# Patient Record
Sex: Male | Born: 1980 | Race: White | Hispanic: No | Marital: Single | Smoking: Current every day smoker
Health system: Southern US, Community
[De-identification: ages and names within clinical notes are randomized; demographics above are authoritative.]

## PROBLEM LIST (undated history)

## (undated) DIAGNOSIS — R569 Unspecified convulsions: Secondary | ICD-10-CM

## (undated) DIAGNOSIS — K219 Gastro-esophageal reflux disease without esophagitis: Secondary | ICD-10-CM

## (undated) DIAGNOSIS — L409 Psoriasis, unspecified: Secondary | ICD-10-CM

## (undated) DIAGNOSIS — T7840XA Allergy, unspecified, initial encounter: Secondary | ICD-10-CM

## (undated) HISTORY — DX: Psoriasis, unspecified: L40.9

## (undated) HISTORY — DX: Allergy, unspecified, initial encounter: T78.40XA

---
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
## 2011-09-22 ENCOUNTER — Emergency Department: Payer: Self-pay | Admitting: Emergency Medicine

## 2011-09-22 LAB — URINALYSIS, COMPLETE
Bacteria: NONE SEEN
Bilirubin,UR: NEGATIVE
Blood: NEGATIVE
Glucose,UR: NEGATIVE mg/dL (ref 0–75)
Leukocyte Esterase: NEGATIVE
Nitrite: NEGATIVE
Ph: 6 (ref 4.5–8.0)
Protein: NEGATIVE
RBC,UR: NONE SEEN /HPF (ref 0–5)
Specific Gravity: 1.011 (ref 1.003–1.030)
Squamous Epithelial: NONE SEEN
WBC UR: <1 /HPF (ref 0–5)

## 2011-09-22 LAB — COMPREHENSIVE METABOLIC PANEL
Albumin: 4.2 g/dL (ref 3.4–5.0)
Alkaline Phosphatase: 64 U/L (ref 50–136)
Anion Gap: 3 — ABNORMAL LOW (ref 7–16)
BUN: 12 mg/dL (ref 7–18)
Bilirubin,Total: 0.6 mg/dL (ref 0.2–1.0)
Calcium, Total: 8.8 mg/dL (ref 8.5–10.1)
Chloride: 100 mmol/L (ref 98–107)
Co2: 30 mmol/L (ref 21–32)
Creatinine: 1.04 mg/dL (ref 0.60–1.30)
EGFR (African American): >60
EGFR (Non-African Amer.): >60
Glucose: 100 mg/dL — ABNORMAL HIGH (ref 65–99)
Osmolality: 266 (ref 275–301)
Potassium: 3.4 mmol/L — ABNORMAL LOW (ref 3.5–5.1)
SGOT(AST): 35 U/L (ref 15–37)
SGPT (ALT): 26 U/L
Sodium: 133 mmol/L — ABNORMAL LOW (ref 136–145)
Total Protein: 7.5 g/dL (ref 6.4–8.2)

## 2011-09-22 LAB — CBC WITH DIFFERENTIAL/PLATELET
Basophil #: 0 10*3/uL (ref 0.0–0.1)
Basophil %: 0.2 %
Eosinophil #: 0 10*3/uL (ref 0.0–0.7)
Eosinophil %: 0.2 %
HCT: 41.2 % (ref 40.0–52.0)
HGB: 14.4 g/dL (ref 13.0–18.0)
Lymphocyte #: 1.7 10*3/uL (ref 1.0–3.6)
Lymphocyte %: 16.4 %
MCH: 32.7 pg (ref 26.0–34.0)
MCHC: 35.1 g/dL (ref 32.0–36.0)
MCV: 93 fL (ref 80–100)
Monocyte #: 0.8 x10 3/mm (ref 0.2–1.0)
Monocyte %: 8 %
Neutrophil #: 7.9 10*3/uL — ABNORMAL HIGH (ref 1.4–6.5)
Neutrophil %: 75.2 %
Platelet: 206 10*3/uL (ref 150–440)
RBC: 4.42 10*6/uL (ref 4.40–5.90)
RDW: 12.1 % (ref 11.5–14.5)
WBC: 10.5 10*3/uL (ref 3.8–10.6)

## 2011-09-22 LAB — ETHANOL
Ethanol %: <0.003 % (ref 0.000–0.080)
Ethanol: <3 mg/dL

## 2014-03-09 ENCOUNTER — Emergency Department: Payer: Self-pay | Admitting: Emergency Medicine

## 2014-03-09 LAB — CBC WITH DIFFERENTIAL/PLATELET
Basophil #: 0.1 10*3/uL (ref 0.0–0.1)
Basophil %: 0.5 %
Eosinophil #: 0.1 10*3/uL (ref 0.0–0.7)
Eosinophil %: 0.9 %
HCT: 43.2 % (ref 40.0–52.0)
HGB: 14.8 g/dL (ref 13.0–18.0)
Lymphocyte #: 4 10*3/uL — ABNORMAL HIGH (ref 1.0–3.6)
Lymphocyte %: 40.2 %
MCH: 32.9 pg (ref 26.0–34.0)
MCHC: 34.3 g/dL (ref 32.0–36.0)
MCV: 96 fL (ref 80–100)
Monocyte #: 0.9 x10 3/mm (ref 0.2–1.0)
Monocyte %: 9.1 %
Neutrophil #: 5 10*3/uL (ref 1.4–6.5)
Neutrophil %: 49.3 %
Platelet: 318 10*3/uL (ref 150–440)
RBC: 4.5 10*6/uL (ref 4.40–5.90)
RDW: 11.8 % (ref 11.5–14.5)
WBC: 10.1 10*3/uL (ref 3.8–10.6)

## 2014-03-09 LAB — URINALYSIS, COMPLETE
Bacteria: NONE SEEN
Bilirubin,UR: NEGATIVE
Blood: NEGATIVE
Glucose,UR: NEGATIVE mg/dL (ref 0–75)
Ketone: NEGATIVE
Leukocyte Esterase: NEGATIVE
Nitrite: NEGATIVE
Ph: 6 (ref 4.5–8.0)
Protein: NEGATIVE
RBC,UR: NONE SEEN /HPF (ref 0–5)
Specific Gravity: 1.01 (ref 1.003–1.030)
Squamous Epithelial: NONE SEEN
WBC UR: <1 /HPF (ref 0–5)

## 2014-03-09 LAB — TROPONIN I: Troponin-I: <0.02 ng/mL

## 2014-03-09 LAB — BASIC METABOLIC PANEL
Anion Gap: 12 (ref 7–16)
BUN: 12 mg/dL (ref 7–18)
Calcium, Total: 8.5 mg/dL (ref 8.5–10.1)
Chloride: 101 mmol/L (ref 98–107)
Co2: 24 mmol/L (ref 21–32)
Creatinine: 0.96 mg/dL (ref 0.60–1.30)
EGFR (African American): >60
EGFR (Non-African Amer.): >60
Glucose: 108 mg/dL — ABNORMAL HIGH (ref 65–99)
Osmolality: 274 (ref 275–301)
Potassium: 2.9 mmol/L — ABNORMAL LOW (ref 3.5–5.1)
Sodium: 137 mmol/L (ref 136–145)

## 2014-05-21 LAB — HEPATIC FUNCTION PANEL
ALT: 34 U/L (ref 10–40)
AST: 34 U/L (ref 14–40)

## 2014-05-21 LAB — BASIC METABOLIC PANEL
BUN: 14 mg/dL (ref 4–21)
CREATININE: 1.1 mg/dL (ref 0.6–1.3)
Glucose: 104 mg/dL
Potassium: 4.4 mmol/L (ref 3.4–5.3)
SODIUM: 138 mmol/L (ref 137–147)

## 2014-05-21 LAB — CBC AND DIFFERENTIAL
HEMATOCRIT: 45 % (ref 41–53)
Hemoglobin: 15.6 g/dL (ref 13.5–17.5)
PLATELETS: 293 10*3/uL (ref 150–399)
WBC: 7 10^3/mL

## 2014-12-27 ENCOUNTER — Encounter: Payer: Self-pay | Admitting: Medical Oncology

## 2014-12-27 ENCOUNTER — Emergency Department
Admission: EM | Admit: 2014-12-27 | Discharge: 2014-12-27 | Disposition: A | Discharge status: Home / Self Care | Payer: Managed Care, Other (non HMO) | Attending: Emergency Medicine | Admitting: Emergency Medicine

## 2014-12-27 DIAGNOSIS — M25562 Pain in left knee: Secondary | ICD-10-CM | POA: Insufficient documentation

## 2014-12-27 DIAGNOSIS — B349 Viral infection, unspecified: Secondary | ICD-10-CM | POA: Insufficient documentation

## 2014-12-27 DIAGNOSIS — M25572 Pain in left ankle and joints of left foot: Secondary | ICD-10-CM | POA: Insufficient documentation

## 2014-12-27 DIAGNOSIS — M25531 Pain in right wrist: Secondary | ICD-10-CM | POA: Insufficient documentation

## 2014-12-27 DIAGNOSIS — M25461 Effusion, right knee: Secondary | ICD-10-CM | POA: Insufficient documentation

## 2014-12-27 DIAGNOSIS — M2550 Pain in unspecified joint: Secondary | ICD-10-CM

## 2014-12-27 DIAGNOSIS — M25431 Effusion, right wrist: Secondary | ICD-10-CM | POA: Diagnosis not present

## 2014-12-27 DIAGNOSIS — L539 Erythematous condition, unspecified: Secondary | ICD-10-CM | POA: Insufficient documentation

## 2014-12-27 DIAGNOSIS — M25561 Pain in right knee: Secondary | ICD-10-CM | POA: Diagnosis not present

## 2014-12-27 DIAGNOSIS — M25532 Pain in left wrist: Secondary | ICD-10-CM | POA: Insufficient documentation

## 2014-12-27 DIAGNOSIS — M25432 Effusion, left wrist: Secondary | ICD-10-CM | POA: Diagnosis not present

## 2014-12-27 DIAGNOSIS — R5383 Other fatigue: Secondary | ICD-10-CM | POA: Diagnosis present

## 2014-12-27 DIAGNOSIS — M25462 Effusion, left knee: Secondary | ICD-10-CM | POA: Insufficient documentation

## 2014-12-27 DIAGNOSIS — M25571 Pain in right ankle and joints of right foot: Secondary | ICD-10-CM | POA: Diagnosis not present

## 2014-12-27 LAB — CBC WITH DIFFERENTIAL/PLATELET
BASOS PCT: 0 %
Basophils Absolute: 0 10*3/uL (ref 0–0.1)
EOS ABS: 0.1 10*3/uL (ref 0–0.7)
Eosinophils Relative: 1 %
HCT: 43.9 % (ref 40.0–52.0)
HEMOGLOBIN: 15.1 g/dL (ref 13.0–18.0)
Lymphocytes Relative: 24 %
Lymphs Abs: 2.3 10*3/uL (ref 1.0–3.6)
MCH: 32.4 pg (ref 26.0–34.0)
MCHC: 34.3 g/dL (ref 32.0–36.0)
MCV: 94.3 fL (ref 80.0–100.0)
Monocytes Absolute: 0.8 10*3/uL (ref 0.2–1.0)
Monocytes Relative: 9 %
NEUTROS PCT: 66 %
Neutro Abs: 6.7 10*3/uL — ABNORMAL HIGH (ref 1.4–6.5)
Platelets: 228 10*3/uL (ref 150–440)
RBC: 4.66 MIL/uL (ref 4.40–5.90)
RDW: 12.1 % (ref 11.5–14.5)
WBC: 9.9 10*3/uL (ref 3.8–10.6)

## 2014-12-27 LAB — BASIC METABOLIC PANEL
ANION GAP: 6 (ref 5–15)
BUN: 12 mg/dL (ref 6–20)
CHLORIDE: 106 mmol/L (ref 101–111)
CO2: 26 mmol/L (ref 22–32)
CREATININE: 0.95 mg/dL (ref 0.61–1.24)
Calcium: 9.2 mg/dL (ref 8.9–10.3)
GFR calc non Af Amer: >60 mL/min (ref >60)
Glucose, Bld: 108 mg/dL — ABNORMAL HIGH (ref 65–99)
POTASSIUM: 4.2 mmol/L (ref 3.5–5.1)
SODIUM: 138 mmol/L (ref 135–145)

## 2014-12-27 LAB — SEDIMENTATION RATE: SED RATE: 7 mm/h (ref 0–15)

## 2014-12-27 MED ORDER — KETOROLAC TROMETHAMINE 30 MG/ML IJ SOLN
INTRAMUSCULAR | Status: AC
  Filled 2014-12-27: qty 1

## 2014-12-27 MED ORDER — KETOROLAC TROMETHAMINE 30 MG/ML IJ SOLN
30.0000 mg | Freq: Once | INTRAMUSCULAR | Status: AC
  Administered 2014-12-27: 30 mg via INTRAMUSCULAR

## 2014-12-27 NOTE — ED Notes (Signed)
Pt was sent from urgent care due to high BP, states he has been dizzy today

## 2014-12-27 NOTE — ED Notes (Signed)
[place] and since he has [place] he had a rash that was itchy which has resolved and now he reports that he is having joint pain and swelling. Pt went to urgent care and was sent here d/t elevated BP.

## 2014-12-27 NOTE — Discharge Instructions (Signed)
Brent Hutchinson, it is not clear what is causing your symptoms however I'm suspicious of a viral infection. We will treat this symptomatically but I would ask that you follow-up with Dr. Gavin Potters given your significant joint symptoms.   Joint Pain Joint pain, which is also called arthralgia, can be caused by many things. Joint pain often goes away when you follow your health care provider's instructions for relieving pain at home. However, joint pain can also be caused by conditions that require further treatment. Common causes of joint pain include:  Bruising in the area of the joint.  Overuse of the joint.  Wear and tear on the joints that occur with aging (osteoarthritis).  Various other forms of arthritis.  A buildup of a crystal form of uric acid in the joint (gout).  Infections of the joint (septic arthritis) or of the bone (osteomyelitis). Your health care provider may recommend medicine to help with the pain. If your joint pain continues, additional tests may be needed to diagnose your condition. HOME CARE INSTRUCTIONS Watch your condition for any changes. Follow these instructions as directed to lessen the pain that you are feeling.  Take medicines only as directed by your health care provider.  Rest the affected area for as long as your health care provider says that you should. If directed to do so, raise the painful joint above the level of your heart while you are sitting or lying down.  Do not do things that cause or worsen pain.  If directed, apply ice to the painful area:  Put ice in a plastic bag.  Place a towel between your skin and the bag.  Leave the ice on for 20 minutes, 2-3 times per day.  Wear an elastic bandage, splint, or sling as directed by your health care provider. Loosen the elastic bandage or splint if your fingers or toes become numb and tingle, or if they turn cold and blue.  Begin exercising or stretching the affected area as directed by your health  care provider. Ask your health care provider what types of exercise are safe for you.  Keep all follow-up visits as directed by your health care provider. This is important. SEEK MEDICAL CARE IF:  Your pain increases, and medicine does not help.  Your joint pain does not improve within 3 days.  You have increased bruising or swelling.  You have a fever.  You lose 10 lb (4.5 kg) or more without trying. SEEK IMMEDIATE MEDICAL CARE IF:  You are not able to move the joint.  Your fingers or toes become numb or they turn cold and blue.   This information is not intended to replace advice given to you by your health care provider. Make sure you discuss any questions you have with your health care provider.   Document Released: 03/06/2005 Document Revised: 03/27/2014 Document Reviewed: 12/16/2013 Elsevier Interactive Patient Education [employer]

## 2014-12-27 NOTE — ED Provider Notes (Signed)
[facility] Emergency Department Provider Note  ____________________________________________  Time seen: 2:30 PM  I have reviewed the triage vital signs and the nursing notes.   HISTORY  Chief Complaint Hypertension and Joint Pain    HPI Brent Hutchinson is a 33 y.o. male who presents with complaints of fatigue, myalgias, joint aches and elevated blood pressure. He was sent over from urgent care. Patient reports he was recently on the [address] of [place]. He arrived home 3 days ago and that night noted a rash on his arms and legs that was itchy. The next day he developed pain and swelling in his ankles and knees and wrists. The rash has resolved. He has never had this before. He does have history of psoriasis. He denies fevers chills. No nausea no vomiting. No chest pain or shortness of breath. No sick contacts. He reports his blood pressures typically normal    History reviewed. No pertinent past medical history.  There are no active problems to display for this patient.   History reviewed. No pertinent past surgical history.  No current outpatient prescriptions on file.  Allergies Review of patient's allergies indicates no known allergies.  No family history on file.  Social History Social History  Substance Use Topics  . Smoking status: Never Smoker   . Smokeless tobacco: None  . Alcohol Use: Yes     Comment: occ    Review of Systems  Constitutional: Negative for fever. Eyes: Negative for visual changes. ENT: Negative for sore throat Cardiovascular: Negative for chest pain. Respiratory: Negative for shortness of breath. Gastrointestinal: Negative for abdominal pain, vomiting and diarrhea. Genitourinary: Negative for dysuria. Musculoskeletal: Negative for back pain. Positive for knee and wrist pain Skin: Positive for rash Neurological: Negative for headaches or focal weakness Psychiatric: No  anxiety    ____________________________________________   PHYSICAL EXAM:  VITAL SIGNS: ED Triage Vitals  Enc Vitals Group     BP 12/27/14 1253 167/98 mmHg     Pulse Rate 12/27/14 1253 73     Resp 12/27/14 1253 16     Temp 12/27/14 1253 97.7 F (36.5 C)     Temp Source 12/27/14 1253 Oral     SpO2 12/27/14 1253 96 %     Weight 12/27/14 1253 208 lb (94.348 kg)     Height 12/27/14 1253  (1.88 m)     Head Cir --      Peak Flow --      Pain Score 12/27/14 1253 9     Pain Loc --      Pain Edu? --      Excl. in GC? --      Constitutional: Alert and oriented. Well appearing and in no distress. Eyes: Conjunctivae are normal.  ENT   Head: Normocephalic and atraumatic.   Mouth/Throat: Mucous membranes are moist. Cardiovascular: Normal rate, regular rhythm. Normal and symmetric distal pulses are present in all extremities. No murmurs, rubs, or gallops. Respiratory: Normal respiratory effort without tachypnea nor retractions. Breath sounds are clear and equal bilaterally.  Gastrointestinal: Soft and non-tender in all quadrants. No distention. There is no CVA tenderness. Genitourinary: deferred Musculoskeletal: Nontender with normal range of motion in all extremities. Patient with mild swelling to bilateral knees and wrists. Very mild erythema noted. Full range of motion without pain, he is able to ambulate easily Neurologic:  Normal speech and language. No gross focal neurologic deficits are appreciated. Skin:  Skin is warm, dry and intact. No rash noted.  Patient showed me a picture of rash which was maculopapular and nonspecific Psychiatric: Mood and affect are normal. Patient exhibits appropriate insight and judgment.  ____________________________________________    LABS (pertinent positives/negatives)  Labs Reviewed  CBC WITH DIFFERENTIAL/PLATELET - Abnormal; Notable for the following:    Neutro Abs 6.7 (*)    All other components within normal limits  BASIC  METABOLIC PANEL - Abnormal; Notable for the following:    Glucose, Bld 108 (*)    All other components within normal limits  SEDIMENTATION RATE    ____________________________________________   EKG  None  ____________________________________________    RADIOLOGY I have personally reviewed any xrays that were ordered on this patient: None  ____________________________________________   PROCEDURES  Procedure(s) performed: none  Critical Care performed: none  ____________________________________________   INITIAL IMPRESSION / ASSESSMENT AND PLAN / ED COURSE  Pertinent labs & imaging results that were available during my care of the patient were reviewed by me and considered in my medical decision making (see chart for details).  Differential diagnosis is long for this presentation: Most likely a viral illness given his symptoms. He was not in a Zika infected area. There is no known local transmission of Chikungunya nor dengue. He does have psoriasis which may predispose him to polyarthralgias. Given his lab work is unremarkable and his vitals are stable we will treat him supportively with NSAIDs and I asked him to follow up with rheumatology in case this requires further workup  ____________________________________________   FINAL CLINICAL IMPRESSION(S) / ED DIAGNOSES  Final diagnoses:  Viral illness  Polyarthralgia     Jene Every, MD 12/27/14 [id number]

## 2015-03-18 DIAGNOSIS — R200 Anesthesia of skin: Secondary | ICD-10-CM | POA: Insufficient documentation

## 2015-03-18 DIAGNOSIS — L409 Psoriasis, unspecified: Secondary | ICD-10-CM | POA: Insufficient documentation

## 2015-03-18 DIAGNOSIS — Z889 Allergy status to unspecified drugs, medicaments and biological substances status: Secondary | ICD-10-CM | POA: Insufficient documentation

## 2015-03-18 DIAGNOSIS — R202 Paresthesia of skin: Secondary | ICD-10-CM

## 2015-03-23 ENCOUNTER — Ambulatory Visit: Payer: Self-pay | Admitting: Family Medicine

## 2015-07-26 ENCOUNTER — Emergency Department: Payer: Managed Care, Other (non HMO)

## 2015-07-26 ENCOUNTER — Emergency Department
Admission: EM | Admit: 2015-07-26 | Discharge: 2015-07-26 | Disposition: A | Discharge status: Home / Self Care | Payer: Managed Care, Other (non HMO) | Attending: Emergency Medicine | Admitting: Emergency Medicine

## 2015-07-26 ENCOUNTER — Encounter: Payer: Self-pay | Admitting: *Deleted

## 2015-07-26 DIAGNOSIS — R0789 Other chest pain: Secondary | ICD-10-CM | POA: Diagnosis present

## 2015-07-26 DIAGNOSIS — R079 Chest pain, unspecified: Secondary | ICD-10-CM

## 2015-07-26 LAB — CBC
HCT: 41.1 % (ref 40.0–52.0)
HEMOGLOBIN: 14.1 g/dL (ref 13.0–18.0)
MCH: 32.5 pg (ref 26.0–34.0)
MCHC: 34.3 g/dL (ref 32.0–36.0)
MCV: 94.8 fL (ref 80.0–100.0)
PLATELETS: 299 10*3/uL (ref 150–440)
RBC: 4.34 MIL/uL — AB (ref 4.40–5.90)
RDW: 11.9 % (ref 11.5–14.5)
WBC: 9.8 10*3/uL (ref 3.8–10.6)

## 2015-07-26 LAB — BASIC METABOLIC PANEL
ANION GAP: 13 (ref 5–15)
BUN: 10 mg/dL (ref 6–20)
CHLORIDE: 102 mmol/L (ref 101–111)
CO2: 22 mmol/L (ref 22–32)
Calcium: 9 mg/dL (ref 8.9–10.3)
Creatinine, Ser: 0.91 mg/dL (ref 0.61–1.24)
Glucose, Bld: 128 mg/dL — ABNORMAL HIGH (ref 65–99)
POTASSIUM: 3 mmol/L — AB (ref 3.5–5.1)
SODIUM: 137 mmol/L (ref 135–145)

## 2015-07-26 LAB — TROPONIN I

## 2015-07-26 LAB — FIBRIN DERIVATIVES D-DIMER (ARMC ONLY): FIBRIN DERIVATIVES D-DIMER (ARMC): 214 (ref 0–499)

## 2015-07-26 MED ORDER — POTASSIUM CHLORIDE CRYS ER 20 MEQ PO TBCR
40.0000 meq | EXTENDED_RELEASE_TABLET | Freq: Once | ORAL | Status: AC
Start: 2015-07-26 — End: 2015-07-26
  Administered 2015-07-26: 40 meq via ORAL
  Filled 2015-07-26: qty 2

## 2015-07-26 NOTE — Discharge Instructions (Signed)
You have been seen in the Emergency Department (ED) today for chest pain.  As we have discussed today’s test results are normal, but you may require further testing. ° °Please follow up with the recommended doctor as instructed above in these documents regarding today’s emergent visit and your recent symptoms to discuss further management.  Continue to take your regular medications. If you are not doing so already, please also take a daily baby aspirin (81 mg), at least until you follow up with your doctor. ° °Return to the Emergency Department (ED) if you experience any further chest pain/pressure/tightness, difficulty breathing, or sudden sweating, or other symptoms that concern you. ° ° °Chest Pain Observation °It is often hard to give a specific diagnosis for the cause of chest pain. Among other possibilities your symptoms might be caused by inadequate oxygen delivery to your heart (angina). Angina that is not treated or evaluated can lead to a heart attack (myocardial infarction) or death. °Blood tests, electrocardiograms, and X-rays may have been done to help determine a possible cause of your chest pain. After evaluation and observation, your health care provider has determined that it is unlikely your pain was caused by an unstable condition that requires hospitalization. However, a full evaluation of your pain may need to be completed, with additional diagnostic testing as directed. It is very important to keep your follow-up appointments. Not keeping your follow-up appointments could result in permanent heart damage, disability, or death. If there is any problem keeping your follow-up appointments, you must call your health care provider. °HOME CARE INSTRUCTIONS  °Due to the slight chance that your pain could be angina, it is important to follow your health care provider's treatment plan and also maintain a healthy lifestyle: °· Maintain or work toward achieving a healthy weight. °· Stay physically active  and exercise regularly. °· Decrease your salt intake. °· Eat a balanced, healthy diet. Talk to a dietitian to learn about heart-healthy foods. °· Increase your fiber intake by including whole grains, vegetables, fruits, and nuts in your diet. °· Avoid situations that cause stress, anger, or depression. °· Take medicines as advised by your health care provider. Report any side effects to your health care provider. Do not stop medicines or adjust the dosages on your own. °· Quit smoking. Do not use nicotine patches or gum until you check with your health care provider. °· Keep your blood pressure, blood sugar, and cholesterol levels within normal limits. °· Limit alcohol intake to no more than 1 drink per day for women who are not pregnant and 2 drinks per day for men. °· Do not abuse drugs. °SEEK IMMEDIATE MEDICAL CARE IF: °You have severe chest pain or pressure which may include symptoms such as: °· You feel pain or pressure in your arms, neck, jaw, or back. °· You have severe back or abdominal pain, feel sick to your stomach (nauseous), or throw up (vomit). °· You are sweating profusely. °· You are having a fast or irregular heartbeat. °· You feel short of breath while at rest. °· You notice increasing shortness of breath during rest, sleep, or with activity. °· You have chest pain that does not get better after rest or after taking your usual medicine. °· You wake from sleep with chest pain. °· You are unable to sleep because you cannot breathe. °· You develop a frequent cough or you are coughing up blood. °· You feel dizzy, faint, or experience extreme fatigue. °· You develop severe weakness, dizziness, fainting,   or chills. °Any of these symptoms may represent a serious problem that is an emergency. Do not wait to see if the symptoms will go away. Call your local emergency services (911 in the [place] Do not drive yourself to the hospital. °MAKE SURE YOU: °· Understand these instructions. °· Will watch your  condition. °· Will get help right away if you are not doing well or get worse. °  °This information is not intended to replace advice given to you by your health care provider. Make sure you discuss any questions you have with your health care provider. °  °Document Released: 04/08/2010 Document Revised: 03/11/2013 Document Reviewed: 09/05/2012 °Elsevier Interactive Patient Education ©2016 [employer] ° °

## 2015-07-26 NOTE — ED Notes (Signed)
Pt arrived to ED via EMS reporting sudden onset of left sided chest pressure and bilateral arm numbness. Pt presents to ED anxious and breathing quickly. Pt reports feeling anxious from situation. Pt reports feeling lightheaded and "seeing stars". No neuro deficits upon arrival.  EMS administered 324 ASA and 1 round of nitro that pt reports decreased chest pain.

## 2015-07-26 NOTE — ED Provider Notes (Signed)
[facility] Emergency Department Provider Note  ____________________________________________  Time seen: Approximately 11:45 AM  I have reviewed the triage vital signs and the nursing notes.   HISTORY  Chief Complaint Chest Pain    HPI Brent Hutchinson is a 35 y.o. male was seated having lunch at work. Reports he suddenly p.m. experiencing a heavy pressure in the left side of his chest, this is associated with a tingling sensation into his left arm. He then began experiencing tingling in both hands. Denies weakness in the arms, trouble speaking, headache or neck pain, ripping or tearing or moving pain. He is not having any abdominal pain. His symptoms have improved and are now approximately a 6 out of 10 after receiving aspirin and nitroglycerin with paramedics.  Patient currently reports he feels sort of "weak all over" and continues to experience a mild left-sided chest pressure. It is improved.  Denies medical history.  Takes no medications.  Denies taking any medications today.  Denies smoking or drug use.  Denies significant family history of any heart disease or early stroke.  No numbness tingling or weakness in any one hand or arm. No droop in the face.   Past Medical History  Diagnosis Date  . Allergy   . Psoriasis     Patient Active Problem List   Diagnosis Date Noted  . History of allergy 03/18/2015  . Psoriasis 03/18/2015  . Numbness and tingling 03/18/2015    History reviewed. No pertinent past surgical history.  Current Outpatient Rx  Name  Route  Sig  Dispense  Refill  . DESONIDE EX               . esomeprazole (NEXIUM) 40 MG capsule   Oral   Take 1 capsule by mouth daily.         . Multiple Vitamins-Minerals (MULTIVITAMIN ADULT PO)   Oral   Take 1 tablet by mouth daily.         . ranitidine (ZANTAC) 150 MG tablet   Oral   Take 1 tablet by mouth daily.           Allergies Review of patient's allergies  indicates no known allergies.  History reviewed. No pertinent family history.  Social History Social History  Substance Use Topics  . Smoking status: Never Smoker   . Smokeless tobacco: None  . Alcohol Use: 0.0 oz/week    0 Standard drinks or equivalent per week     Comment: 5-8 drinks on the weekend; drinks hard liquor    Review of Systems Constitutional: No fever/chills Eyes: No visual changes. ENT: No sore throat. Cardiovascular:See history of present illness Respiratory: Denies shortness of breath. Gastrointestinal: No abdominal pain.  No nausea, no vomiting.  No diarrhea.  No constipation. Genitourinary: Negative for dysuria. Musculoskeletal: Negative for back pain. Skin: Negative for rash. Neurological: Negative for headaches, focal weakness or numbness except for a tingling E experiencing in both hands, but seems a little worse over the left arm and left chest.  10-point ROS otherwise negative.  ____________________________________________   PHYSICAL EXAM:  VITAL SIGNS: ED Triage Vitals  Enc Vitals Group     BP --      Pulse Rate 07/26/15 1142 84     Resp 07/26/15 1142 23     Temp 07/26/15 1142 97.6 F (36.4 C)     Temp Source 07/26/15 1142 Oral     SpO2 07/26/15 1142 100 %     Weight 07/26/15 1142 202  lb (91.627 kg)     Height 07/26/15 1142 6\' 2"  (1.88 m)     Head Cir --      Peak Flow --      Pain Score 07/26/15 1144 8     Pain Loc --      Pain Edu? --      Excl. in GC? --    Constitutional: Alert and oriented. Very anxious, slightly diaphoretic but in no acute distress. Eyes: Conjunctivae are normal. PERRL. EOMI. Head: Atraumatic. Nose: No congestion/rhinnorhea. Mouth/Throat: Mucous membranes are moist.  Oropharynx non-erythematous. Neck: No stridor.   Cardiovascular: Normal rate, regular rhythm. Grossly normal heart sounds.  Good peripheral circulation. Respiratory: Normal respiratory effort. There is mild tachypnea. No retractions. Lungs  CTAB. Gastrointestinal: Soft and nontender. No distention. No abdominal bruits. No CVA tenderness. Musculoskeletal: No lower extremity tenderness nor edema.  No joint effusions. Neurologic:  Normal speech and language. No gross focal neurologic deficits are appreciated.   NIH score equals 0, performed by me at bedside at 1145AM. The patient has no pronator drift. The patient has normal cranial nerve exam. Extraocular movements are normal. Visual fields are normal. Patient has 5 out of 5 strength in all extremities. There is no numbness or gross, acute sensory abnormality in the extremities bilaterally. No speech disturbance. No dysarthria. No aphasia. No ataxia. Normal finger nose finger bilat. Patient speaking in full and clear sentences.  Skin:  Skin is warm, diaphoretic and intact. No rash noted. Psychiatric: Mood and affect are anxious. Speech and behavior are normal.  ____________________________________________   LABS (all labs ordered are listed, but only abnormal results are displayed)  Labs Reviewed  CBC - Abnormal; Notable for the following:    RBC 4.34 (*)    All other components within normal limits  BASIC METABOLIC PANEL - Abnormal; Notable for the following:    Potassium 3.0 (*)    Glucose, Bld 128 (*)    All other components within normal limits  TROPONIN I  FIBRIN DERIVATIVES D-DIMER ([facility] ONLY)  TROPONIN I   ____________________________________________  EKG  ED ECG REPORT I, QUALE, MARK, the attending physician, personally viewed and interpreted this ECG.  Date: 07/26/2015 EKG Time: 1138 Rate: 95 Rhythm: normal sinus rhythm, right bundle branch block QRS Axis: normal Intervals: normal ST/T Wave abnormalities: normal Conduction Disturbances: Right bundle branch block Narrative Interpretation: Right bundle branch block otherwise unremarkable without ischemic abnormality.  Repeat EKG reviewed and to revive me at 12:06 PM Rhythm: normal sinus  rhythm, right bundle branch block QRS Axis: normal Intervals: normal ST/T Wave abnormalities: normal Conduction Disturbances: Right bundle branch block Narrative Interpretation: Right bundle branch block otherwise unremarkable without ischemic abnormality. ____________________________________________  RADIOLOGY  DG Chest 2 View (Final result) Result time: 07/26/15 12:06:47   Final result by Rad Results In Interface (07/26/15 12:06:47)   Narrative:   CLINICAL DATA: Sudden onset of left chest pain and bilateral arm numbness.  EXAM: CHEST 2 VIEW  COMPARISON: None.  FINDINGS: Both lungs are clear. Heart and mediastinum are within normal limits. The trachea is midline. Bony structures are unremarkable. No large pleural effusions.  IMPRESSION: No active cardiopulmonary disease.   Electronically Signed By: Richarda Overlie M.D. On: 07/26/2015 12:06    ____________________________________________   PROCEDURES  Procedure(s) performed: None  Critical Care performed: No  ____________________________________________   INITIAL IMPRESSION / ASSESSMENT AND PLAN / ED COURSE  Pertinent labs & imaging results that were available during my care of the patient were reviewed by  me and considered in my medical decision making (see chart for details).  Patient presents for evaluation of left-sided chest pressure with associated paresthesia into the left arm and also into both hands. EKG reassuring, no evidence of ischemic change. First troponin negative. Patient has no significant risk factor for coronary disease. His d-dimer which was sent to screen for low risk for dissection and pulmonary embolism is negative. Chest x-ray normal with a normal mediastinum making and dissection exceedingly unlikely.  No associated abdominal pain or pulmonary symptoms. Normal neurologic examination, NIH score is 0 performed by me at bedside. No associated headache or neurologic symptom other than  paresthesias in both hands.  ----------------------------------------- 1:24 PM on 07/26/2015 -----------------------------------------  Patient reports feeling improvement. He is currently resting comfortably.  ----------------------------------------- 3:49 PM on 07/26/2015 -----------------------------------------  Patient reports he is feeling much better. He is currently dressed, and walking without issue within the room and states that he is quite ready to go home. Discussed case, EKG and troponin to Dr. Cassie Freer who recommends outpatient follow-up within the next 1-3 days which cardiology will be able to provide. Discussed with the patient and will call the cardiology clinic for follow-up.  Return precautions and treatment recommendations and follow-up discussed with the patient who is agreeable with the plan.   ____________________________________________   FINAL CLINICAL IMPRESSION(S) / ED DIAGNOSES  Final diagnoses:  Chest pain with low risk for cardiac etiology      Sharyn Creamer, MD 07/26/15 1552

## 2016-02-07 ENCOUNTER — Emergency Department
Admission: EM | Admit: 2016-02-07 | Discharge: 2016-02-07 | Disposition: A | Discharge status: Home / Self Care | Payer: Managed Care, Other (non HMO) | Attending: Emergency Medicine | Admitting: Emergency Medicine

## 2016-02-07 ENCOUNTER — Encounter: Payer: Self-pay | Admitting: Emergency Medicine

## 2016-02-07 ENCOUNTER — Emergency Department: Payer: Managed Care, Other (non HMO)

## 2016-02-07 DIAGNOSIS — S6992XA Unspecified injury of left wrist, hand and finger(s), initial encounter: Secondary | ICD-10-CM | POA: Diagnosis present

## 2016-02-07 DIAGNOSIS — Y990 Civilian activity done for income or pay: Secondary | ICD-10-CM | POA: Insufficient documentation

## 2016-02-07 DIAGNOSIS — Y929 Unspecified place or not applicable: Secondary | ICD-10-CM | POA: Insufficient documentation

## 2016-02-07 DIAGNOSIS — Y9389 Activity, other specified: Secondary | ICD-10-CM | POA: Diagnosis not present

## 2016-02-07 DIAGNOSIS — Z23 Encounter for immunization: Secondary | ICD-10-CM | POA: Diagnosis not present

## 2016-02-07 DIAGNOSIS — S62515B Nondisplaced fracture of proximal phalanx of left thumb, initial encounter for open fracture: Secondary | ICD-10-CM | POA: Insufficient documentation

## 2016-02-07 DIAGNOSIS — W228XXA Striking against or struck by other objects, initial encounter: Secondary | ICD-10-CM | POA: Insufficient documentation

## 2016-02-07 MED ORDER — TETANUS-DIPHTH-ACELL PERTUSSIS 5-2.5-18.5 LF-MCG/0.5 IM SUSP
0.5000 mL | Freq: Once | INTRAMUSCULAR | Status: AC
  Administered 2016-02-07: 0.5 mL via INTRAMUSCULAR
  Filled 2016-02-07: qty 0.5

## 2016-02-07 MED ORDER — SULFAMETHOXAZOLE-TRIMETHOPRIM 800-160 MG PO TABS: 1.0000 | ORAL_TABLET | Freq: Two times a day (BID) | ORAL | 0 refills | Status: DC

## 2016-02-07 MED ORDER — HYDROCODONE-ACETAMINOPHEN 5-325 MG PO TABS: 1.0000 | ORAL_TABLET | Freq: Four times a day (QID) | ORAL | 0 refills | Status: DC | PRN

## 2016-02-07 MED ORDER — CEFAZOLIN SODIUM 1 G IJ SOLR
1.0000 g | Freq: Once | INTRAMUSCULAR | Status: AC
  Administered 2016-02-07: 1 g via INTRAMUSCULAR
  Filled 2016-02-07 (×2): qty 10

## 2016-02-07 MED ORDER — CEPHALEXIN 500 MG PO CAPS: 500.0000 mg | ORAL_CAPSULE | Freq: Four times a day (QID) | ORAL | 0 refills | Status: DC

## 2016-02-07 NOTE — ED Provider Notes (Signed)
[facility] Emergency Department Provider Note  ____________________________________________   First MD Initiated Contact with Patient 02/07/16 1621     (approximate)  I have reviewed the triage vital signs and the nursing notes.   HISTORY  Chief Complaint Hand Pain and Laceration   HPI Brent Hutchinson is a 35 y.o. male here with complaint of left thumb pain. Patient sustained an injury while working on a piece of metal that resulted in a laceration to his thumb. Patient states that the metal "kicked back" hitting his thumb causing his injury. He states that he cleaned his wound with alcohol and bourbon. Today he states that he "felt a pop" and has been unable to extend his thumb. He has not been seen prior to this. He is uncertain of his last tetanus per believes it is been over 10 years. Today he states that he has been drinking alcohol to "kill the pain". He states he does not have an alcohol problem and denies use of recreational drugs. Currently rates his pain as an 8 out of 10.   Past Medical History:  Diagnosis Date  . Allergy   . Psoriasis     Patient Active Problem List   Diagnosis Date Noted  . History of allergy 03/18/2015  . Psoriasis 03/18/2015  . Numbness and tingling 03/18/2015    History reviewed. No pertinent surgical history.  Prior to Admission medications   Medication Sig Start Date End Date Taking? Authorizing Provider  cephALEXin (KEFLEX) 500 MG capsule Take 1 capsule (500 mg total) by mouth 4 (four) times daily. 02/07/16   Tommi Rumpshonda L Summers, PA-C  HYDROcodone-acetaminophen (NORCO/VICODIN) 5-325 MG tablet Take 1 tablet by mouth every 6 (six) hours as needed for moderate pain. 02/07/16   Tommi Rumpshonda L Summers, PA-C  Multiple Vitamins-Minerals (MULTIVITAMIN ADULT PO) Take 1 tablet by mouth daily.    Historical Provider, MD  sulfamethoxazole-trimethoprim (BACTRIM DS,SEPTRA DS) 800-160 MG tablet Take 1 tablet by mouth 2 (two) times daily.  02/07/16   Tommi Rumpshonda L Summers, PA-C    Allergies Patient has no known allergies.  No family history on file.  Social History Social History  Substance Use Topics  . Smoking status: Never Smoker  . Smokeless tobacco: Never Used  . Alcohol use 0.0 oz/week     Comment: 5-8 drinks on the weekend; drinks hard liquor    Review of Systems Constitutional: No fever/chills Cardiovascular: Denies chest pain. Respiratory: Denies shortness of breath. Gastrointestinal: No abdominal pain.  No nausea, no vomiting.   Musculoskeletal: Positive left thumb pain. Skin: Positive laceration left thumb. Neurological: Negative for headaches, focal weakness or numbness.  10-point ROS otherwise negative.  ____________________________________________   PHYSICAL EXAM:  VITAL SIGNS: ED Triage Vitals  Enc Vitals Group     BP 02/07/16 1612 (!) 154/95     Pulse Rate 02/07/16 1612 (!) 110     Resp 02/07/16 1612 18     Temp 02/07/16 1612 98.4 F (36.9 C)     Temp Source 02/07/16 1612 Oral     SpO2 02/07/16 1612 98 %     Weight 02/07/16 1613 200 lb (90.7 kg)     Height 02/07/16 1613 6\' 2"  (1.88 m)     Head Circumference --      Peak Flow --      Pain Score 02/07/16 1613 8     Pain Loc --      Pain Edu? --      Excl. in GC? --  Constitutional: Alert and oriented. Well appearing and in no acute distress. Eyes: Conjunctivae are normal. PERRL. EOMI. Head: Atraumatic. Nose: No congestion/rhinnorhea. Neck: No stridor.   Cardiovascular: Normal rate, regular rhythm. Grossly normal heart sounds.  Good peripheral circulation. Respiratory: Normal respiratory effort.  No retractions. Lungs CTAB. Musculoskeletal: Examination of the left thumb there is a 1/2 cm linear laceration on the dorsal aspect without active bleeding. Thumb is slightly in flexion and patient is unable to fully extend his thumb. There is some minimal soft tissue swelling surrounding the laceration but no erythema or evidence of  infection at this time. There is no obvious foreign body noted in the laceration. Sensory function is intact and capillary refill is less than 3 seconds. Neurologic:  Normal speech and language. No gross focal neurologic deficits are appreciated. No gait instability. Skin:  Skin is warm, dry. Laceration left thumb as described above. Psychiatric: Mood and affect are normal. Speech and behavior are normal.  ____________________________________________   LABS (all labs ordered are listed, but only abnormal results are displayed)  Labs Reviewed - No data to display  RADIOLOGY Left thumb x-ray per radiologist: IMPRESSION: Acute nondisplaced left thumb middle phalanx fracture as above. Questioned a small bone fragments present. Mild soft tissue swelling in this area. I, Tommi Rumps, personally viewed and evaluated these images (plain radiographs) as part of my medical decision making, as well as reviewing the written report by the radiologist.  ____________________________________________   PROCEDURES  Procedure(s) performed: None  Procedures  Critical Care performed: No  ____________________________________________   INITIAL IMPRESSION / ASSESSMENT AND PLAN / ED COURSE  Pertinent labs & imaging results that were available during my care of the patient were reviewed by me and considered in my medical decision making (see chart for details).    Clinical Course    Dr. Hyacinth Meeker related information through the OR nurse that he would see this patient in the office. Contact information was given to the patient with encouragement to make this phone call first thing in the morning. Wound was cleaned and dressed. Patient was placed in a metal splint for protection and support. Patient was given Ancef 1 g IM. Patient was given a prescription for Keflex 500 mg 4 times a day and Septra DS twice a day for 10 days. Patient was given also prescription for Norco as needed for severe pain. He  is made aware that he has an open fracture to his left thumb and it is urgent that he see an orthopedist for continued care.  Patient was also made aware that he could not drink while taking pain medication. Patient was also made aware that most likely he has a tendon injury which may need repair. Further evaluation by the orthopedist will be needed.  ____________________________________________   FINAL CLINICAL IMPRESSION(S) / ED DIAGNOSES  Final diagnoses:  Open nondisplaced fracture of proximal phalanx of left thumb, initial encounter      NEW MEDICATIONS STARTED DURING THIS VISIT:  Discharge Medication List as of 02/07/2016  6:31 PM    START taking these medications   Details  cephALEXin (KEFLEX) 500 MG capsule Take 1 capsule (500 mg total) by mouth 4 (four) times daily., Starting Mon 02/07/2016, Print    HYDROcodone-acetaminophen (NORCO/VICODIN) 5-325 MG tablet Take 1 tablet by mouth every 6 (six) hours as needed for moderate pain., Starting Mon 02/07/2016, Print    sulfamethoxazole-trimethoprim (BACTRIM DS,SEPTRA DS) 800-160 MG tablet Take 1 tablet by mouth 2 (two) times daily., Starting Mon  02/07/2016, Print         Note:  This document was prepared using Dragon voice recognition software and may include unintentional dictation errors.    Tommi RumpsRhonda L Summers, PA-C 02/07/16 1928    Myrna Blazeravid Matthew Schaevitz, MD 02/07/16 2038

## 2016-02-07 NOTE — Discharge Instructions (Signed)
Call Dr. Rondel BatonMiller's office tomorrow to make an appointment to be seen in the office. The phone number is listed on your discharge papers. Wear splint for protection. Keep area clean and dry. Begin taking your antibiotics tomorrow, Keflex 500 mg 4 times a day and Septra DS twice a day for 10 days. Take Norco for severe pain.  You can not drink alcohol while taking this medication.   This is considered an open fracture and you must be seen by an orthopedist. Every step is being taken to ensure that the bone does not get infected. Make sure that you call Dr. Rondel BatonMiller's office tomorrow for your appointment.

## 2016-02-07 NOTE — ED Triage Notes (Signed)
States he was working on a machine and a piece of metal shot back and lacerated left thumb 2 days ago    But now haivng increased pain and swelling to left thumb

## 2016-02-09 ENCOUNTER — Other Ambulatory Visit: Payer: Self-pay | Admitting: Specialist

## 2016-02-14 ENCOUNTER — Ambulatory Visit: Payer: Managed Care, Other (non HMO) | Admitting: Anesthesiology

## 2016-02-14 ENCOUNTER — Encounter: Payer: Self-pay | Admitting: *Deleted

## 2016-02-14 ENCOUNTER — Encounter
Admission: RE | Disposition: A | Discharge status: Home / Self Care | Payer: Self-pay | Source: Ambulatory Visit | Attending: Specialist

## 2016-02-14 ENCOUNTER — Ambulatory Visit
Admission: RE | Admit: 2016-02-14 | Discharge: 2016-02-14 | Disposition: A | Discharge status: Home / Self Care | Payer: Managed Care, Other (non HMO) | Source: Ambulatory Visit | Attending: Specialist | Admitting: Specialist

## 2016-02-14 DIAGNOSIS — I4510 Unspecified right bundle-branch block: Secondary | ICD-10-CM | POA: Diagnosis not present

## 2016-02-14 DIAGNOSIS — S66212A Strain of extensor muscle, fascia and tendon of left thumb at wrist and hand level, initial encounter: Secondary | ICD-10-CM | POA: Insufficient documentation

## 2016-02-14 DIAGNOSIS — X58XXXA Exposure to other specified factors, initial encounter: Secondary | ICD-10-CM | POA: Insufficient documentation

## 2016-02-14 HISTORY — PX: REPAIR EXTENSOR TENDON: SHX5382

## 2016-02-14 SURGERY — REPAIR, TENDON, EXTENSOR
Anesthesia: General | Laterality: Left

## 2016-02-14 MED ORDER — PROPOFOL 10 MG/ML IV BOLUS
INTRAVENOUS | Status: DC | PRN
  Administered 2016-02-14: 200 mg via INTRAVENOUS

## 2016-02-14 MED ORDER — HYDROCODONE-ACETAMINOPHEN 7.5-325 MG PO TABS
1.0000 | ORAL_TABLET | Freq: Four times a day (QID) | ORAL | Status: AC | PRN
  Administered 2016-02-14: 1 via ORAL

## 2016-02-14 MED ORDER — HYDROCODONE-ACETAMINOPHEN 7.5-325 MG PO TABS
ORAL_TABLET | ORAL | Status: AC
  Administered 2016-02-14: 1 via ORAL
  Filled 2016-02-14: qty 1

## 2016-02-14 MED ORDER — CHLORHEXIDINE GLUCONATE CLOTH 2 % EX PADS: 6.0000 | MEDICATED_PAD | Freq: Once | CUTANEOUS | Status: DC

## 2016-02-14 MED ORDER — CEFAZOLIN SODIUM-DEXTROSE 2-4 GM/100ML-% IV SOLN
INTRAVENOUS | Status: AC
  Filled 2016-02-14: qty 100

## 2016-02-14 MED ORDER — FENTANYL CITRATE (PF) 100 MCG/2ML IJ SOLN
INTRAMUSCULAR | Status: AC
  Filled 2016-02-14: qty 2

## 2016-02-14 MED ORDER — MELOXICAM 7.5 MG PO TABS
ORAL_TABLET | ORAL | Status: AC
  Filled 2016-02-14: qty 2

## 2016-02-14 MED ORDER — FENTANYL CITRATE (PF) 100 MCG/2ML IJ SOLN
25.0000 ug | INTRAMUSCULAR | Status: DC | PRN
  Administered 2016-02-14 (×4): 25 ug via INTRAVENOUS

## 2016-02-14 MED ORDER — ONDANSETRON HCL 4 MG/2ML IJ SOLN: 4.0000 mg | Freq: Once | INTRAMUSCULAR | Status: DC | PRN

## 2016-02-14 MED ORDER — CLINDAMYCIN PHOSPHATE 600 MG/50ML IV SOLN
600.0000 mg | Freq: Once | INTRAVENOUS | Status: AC
  Administered 2016-02-14: 600 mg via INTRAVENOUS

## 2016-02-14 MED ORDER — LIDOCAINE HCL (CARDIAC) 20 MG/ML IV SOLN
INTRAVENOUS | Status: DC | PRN
  Administered 2016-02-14: 30 mg via INTRAVENOUS

## 2016-02-14 MED ORDER — GLYCOPYRROLATE 0.2 MG/ML IJ SOLN
INTRAMUSCULAR | Status: DC | PRN
Start: 2016-02-14 — End: 2016-02-14
  Administered 2016-02-14: 0.2 mg via INTRAVENOUS

## 2016-02-14 MED ORDER — LACTATED RINGERS IV SOLN
INTRAVENOUS | Status: DC | PRN
  Administered 2016-02-14: 13:00:00 via INTRAVENOUS

## 2016-02-14 MED ORDER — MELOXICAM 15 MG PO TABS: 15.0000 mg | ORAL_TABLET | Freq: Every day | ORAL | 3 refills | Status: DC

## 2016-02-14 MED ORDER — CEFAZOLIN SODIUM-DEXTROSE 2-4 GM/100ML-% IV SOLN
2.0000 g | INTRAVENOUS | Status: AC
  Administered 2016-02-14: 2 g via INTRAVENOUS

## 2016-02-14 MED ORDER — BUPIVACAINE HCL (PF) 0.5 % IJ SOLN
INTRAMUSCULAR | Status: AC
  Filled 2016-02-14: qty 30

## 2016-02-14 MED ORDER — MELOXICAM 7.5 MG PO TABS
15.0000 mg | ORAL_TABLET | Freq: Once | ORAL | Status: AC
  Administered 2016-02-14: 15 mg via ORAL

## 2016-02-14 MED ORDER — FENTANYL CITRATE (PF) 100 MCG/2ML IJ SOLN
INTRAMUSCULAR | Status: DC | PRN
  Administered 2016-02-14 (×3): 50 ug via INTRAVENOUS
  Administered 2016-02-14 (×2): 25 ug via INTRAVENOUS

## 2016-02-14 MED ORDER — HYDROCODONE-ACETAMINOPHEN 7.5-325 MG PO TABS: 1.0000 | ORAL_TABLET | Freq: Four times a day (QID) | ORAL | 0 refills | Status: DC | PRN

## 2016-02-14 MED ORDER — GABAPENTIN 400 MG PO CAPS
ORAL_CAPSULE | ORAL | Status: AC
  Administered 2016-02-14: 400 mg via ORAL
  Filled 2016-02-14: qty 1

## 2016-02-14 MED ORDER — CLINDAMYCIN PHOSPHATE 600 MG/50ML IV SOLN
INTRAVENOUS | Status: AC
  Filled 2016-02-14: qty 50

## 2016-02-14 MED ORDER — BUPIVACAINE HCL 0.5 % IJ SOLN
INTRAMUSCULAR | Status: DC | PRN
  Administered 2016-02-14: 10 mL

## 2016-02-14 MED ORDER — MIDAZOLAM HCL 2 MG/2ML IJ SOLN
INTRAMUSCULAR | Status: DC | PRN
  Administered 2016-02-14: 2 mg via INTRAVENOUS

## 2016-02-14 MED ORDER — GABAPENTIN 400 MG PO CAPS: 400.0000 mg | ORAL_CAPSULE | Freq: Two times a day (BID) | ORAL | 3 refills | Status: DC

## 2016-02-14 MED ORDER — ONDANSETRON HCL 4 MG/2ML IJ SOLN
INTRAMUSCULAR | Status: DC | PRN
  Administered 2016-02-14: 4 mg via INTRAVENOUS

## 2016-02-14 MED ORDER — GABAPENTIN 400 MG PO CAPS
400.0000 mg | ORAL_CAPSULE | Freq: Once | ORAL | Status: AC
  Administered 2016-02-14: 400 mg via ORAL

## 2016-02-14 SURGICAL SUPPLY — 44 items
BAND RUBBER 3X1/6 STRL (MISCELLANEOUS) ×1 IMPLANT
BAND RUBBER 3X1/6 TAN STRL (MISCELLANEOUS) IMPLANT
BLADE SURG MINI STRL (BLADE) ×3 IMPLANT
BNDG ESMARK 4X12 TAN STRL LF (GAUZE/BANDAGES/DRESSINGS) ×3 IMPLANT
CANISTER SUCT 1200ML W/VALVE (MISCELLANEOUS) ×3 IMPLANT
CHLORAPREP W/TINT 26ML (MISCELLANEOUS) ×3 IMPLANT
CUFF TOURN 18 STER (MISCELLANEOUS) IMPLANT
ELECT REM PT RETURN 9FT ADLT (ELECTROSURGICAL) ×3
ELECTRODE REM PT RTRN 9FT ADLT (ELECTROSURGICAL) ×1 IMPLANT
GAUZE FLUFF 18X24 1PLY STRL (GAUZE/BANDAGES/DRESSINGS) ×3 IMPLANT
GAUZE PETRO XEROFOAM 1X8 (MISCELLANEOUS) ×5 IMPLANT
GAUZE SPONGE 4X4 12PLY STRL (GAUZE/BANDAGES/DRESSINGS) ×3 IMPLANT
GAUZE XEROFORM 4X4 STRL (GAUZE/BANDAGES/DRESSINGS) ×2 IMPLANT
GLOVE SURG ORTHO 8.0 STRL STRW (GLOVE) ×3 IMPLANT
GOWN STRL REUS W/ TWL LRG LVL3 (GOWN DISPOSABLE) ×1 IMPLANT
GOWN STRL REUS W/TWL LRG LVL3 (GOWN DISPOSABLE) ×3
GOWN STRL REUS W/TWL LRG LVL4 (GOWN DISPOSABLE) ×3 IMPLANT
KIT RM TURNOVER STRD PROC AR (KITS) ×3 IMPLANT
NDL KEITH SZ2.5 (NEEDLE) IMPLANT
NS IRRIG 500ML POUR BTL (IV SOLUTION) ×3 IMPLANT
PACK EXTREMITY ARMC (MISCELLANEOUS) ×3 IMPLANT
PAD CAST CTTN 4X4 STRL (SOFTGOODS) ×1 IMPLANT
PADDING CAST 3IN STRL (MISCELLANEOUS) ×2
PADDING CAST BLEND 3X4 STRL (MISCELLANEOUS) ×1 IMPLANT
PADDING CAST COTTON 4X4 STRL (SOFTGOODS) ×3
SPLINT CAST 1 STEP 3X12 (MISCELLANEOUS) ×3 IMPLANT
STOCKINETTE 48X4 2 PLY STRL (GAUZE/BANDAGES/DRESSINGS) ×1 IMPLANT
STOCKINETTE BIAS CUT 3 980034 (MISCELLANEOUS) ×3 IMPLANT
STOCKINETTE BIAS CUT 4 980044 (GAUZE/BANDAGES/DRESSINGS) ×3 IMPLANT
STOCKINETTE STRL 4IN 9604848 (GAUZE/BANDAGES/DRESSINGS) ×3 IMPLANT
SUT ETHIBOND 3-0  EXTR (SUTURE) ×2
SUT ETHIBOND 3-0 EXTR (SUTURE) ×1 IMPLANT
SUT ETHILON 4 0 P 3 18 (SUTURE) ×3 IMPLANT
SUT ETHILON 4-0 (SUTURE) ×3
SUT ETHILON 4-0 FS2 18XMFL BLK (SUTURE) ×1
SUT ETHILON 5-0 (SUTURE) ×6
SUT ETHILON 5-0 C-3 18XMFL BLK (SUTURE) ×2
SUT MERSILENE 4-0 WHT RB-1 (SUTURE) ×2 IMPLANT
SUT POLY BUTTON 15MM (SUTURE) IMPLANT
SUT VIC AB 2-0 SH 27 (SUTURE) ×3
SUT VIC AB 2-0 SH 27XBRD (SUTURE) IMPLANT
SUT VIC AB 4-0 FS2 27 (SUTURE) ×3 IMPLANT
SUTURE ETHLN 4-0 FS2 18XMF BLK (SUTURE) ×1 IMPLANT
SUTURE ETHLN 5-0 C3 18XMF BLK (SUTURE) ×1 IMPLANT

## 2016-02-14 NOTE — Progress Notes (Signed)
States pain is about the same but wishes to go home 

## 2016-02-14 NOTE — Progress Notes (Signed)
Post op instructions given to sister with verbal understanding acknowledged

## 2016-02-14 NOTE — Op Note (Signed)
02/14/2016  2:16 PM  PATIENT:  Brent Hutchinson    PRE-OPERATIVE DIAGNOSIS:  [id number] Strain of extensor musc/fasc/tend l thm at wrs/hnd lv, init  POST-OPERATIVE DIAGNOSIS:  Same  PROCEDURE:  REPAIR EXTENSOR TENDON LEFT THUMB   SURGEON:  Valinda HoarMILLER,Aivan Fillingim E, MD  ANESTHESIA:   General  PREOPERATIVE INDICATIONS:  Brent SpiesJustin K Henes is a  35 y.o. male with a diagnosis of S66.212A Strain of extensor musc/fasc/tend l thm at wrs/hnd lv, init who failed conservative measures and elected for surgical management.    The risks benefits and alternatives were discussed with the patient preoperatively including but not limited to the risks of infection, bleeding, nerve injury, cardiopulmonary complications, the need for revision surgery, among others, and the patient was willing to proceed.  EBL: NONE  TOURNIQUET TIME: 30 MIN  OPERATIVE IMPLANTS: NONE  OPERATIVE FINDINGS: Laceration extensor tendon left thumb over proximal phalanx near IP joint  OPERATIVE PROCEDURE:  Patient was brought to the operating room and underwent satisfactory general anesthesia in the supine position. The arm was prepped and draped in a sterile fashion and an Esmarch applied. Tourniquet was inflated to 250 mmHg. The previous incision was reopened and extended proximally and distally in a zigzag fashion. Blunt dissection was carried out exposing the site of tendon laceration. The proximal tendon had retracted about a centimeter and a half. The distal tendon was exposed and freed up from scar. The proximal portion was also freed up from scar. The tendon ends were then reapproximated using 2-0 Vicryl and 4-0 Ethibond suture with the tip of the thumb fully extended. Multiple sutures were used. This provided excellent restoration of extension. Wound was irrigated and closed with 5-0 nylon sutures. 1/2% Sensorcaine was infiltrated into the tissues.Sponge and needle counts were correct.  A well-padded hand dressing with a thumb  spica splint was applied. Tourniquet was deflated with good return of blood flow to the hand. The patient was awakened and taken recovery in good condition.

## 2016-02-14 NOTE — Discharge Instructions (Signed)

## 2016-02-14 NOTE — Anesthesia Preprocedure Evaluation (Addendum)
Anesthesia Evaluation  Patient identified by MRN, date of birth, ID band Patient awake    Reviewed: Allergy & Precautions, NPO status , Patient's Chart, lab work & pertinent test results, reviewed documented beta blocker date and time   Airway Mallampati: II  TM Distance: >3 FB     Dental  (+) Chipped   Pulmonary           Cardiovascular      Neuro/Psych    GI/Hepatic   Endo/Other    Renal/GU      Musculoskeletal   Abdominal   Peds  Hematology   Anesthesia Other Findings Old  rbbb.  Reproductive/Obstetrics                            Anesthesia Physical Anesthesia Plan  ASA: II  Anesthesia Plan: General   Post-op Pain Management:    Induction: Intravenous  Airway Management Planned: LMA  Additional Equipment:   Intra-op Plan:   Post-operative Plan:   Informed Consent: I have reviewed the patients History and Physical, chart, labs and discussed the procedure including the risks, benefits and alternatives for the proposed anesthesia with the patient or authorized representative who has indicated his/her understanding and acceptance.     Plan Discussed with: CRNA  Anesthesia Plan Comments:         Anesthesia Quick Evaluation

## 2016-02-14 NOTE — Anesthesia Procedure Notes (Signed)
Procedure Name: LMA Insertion Date/Time: 02/14/2016 1:24 PM Performed by: Brent Hutchinson, Brent Macqueen Pre-anesthesia Checklist: Patient identified, Patient being monitored, Timeout performed, Emergency Drugs available and Suction available Patient Re-evaluated:Patient Re-evaluated prior to inductionOxygen Delivery Method: Circle system utilized Preoxygenation: Pre-oxygenation with 100% oxygen Intubation Type: IV induction Ventilation: Mask ventilation without difficulty LMA: LMA inserted LMA Size: 4.0 Tube type: Oral Number of attempts: 1 Placement Confirmation: positive ETCO2 and breath sounds checked- equal and bilateral Tube secured with: Tape Dental Injury: Teeth and Oropharynx as per pre-operative assessment

## 2016-02-14 NOTE — Anesthesia Postprocedure Evaluation (Signed)
Anesthesia Post Note  Patient: Brent Hutchinson  Procedure(s) Performed: Procedure(s) (LRB): REPAIR EXTENSOR TENDON (Left)  Patient location during evaluation: PACU Anesthesia Type: General Level of consciousness: awake and alert Pain management: pain level controlled Vital Signs Assessment: post-procedure vital signs reviewed and stable Respiratory status: spontaneous breathing, nonlabored ventilation, respiratory function stable and patient connected to nasal cannula oxygen Cardiovascular status: blood pressure returned to baseline and stable Postop Assessment: no signs of nausea or vomiting Anesthetic complications: no    Last Vitals:  Vitals:   02/14/16 1510 02/14/16 1533  BP: 139/76 134/73  Pulse: 72 64  Resp: 16   Temp: 36.9 C     Last Pain:  Vitals:   02/14/16 1510  TempSrc: Temporal  PainSc: 3                  Alithia Zavaleta S

## 2016-02-14 NOTE — Progress Notes (Signed)
Can wiggle fingers on left hand   Fingers warm and dry

## 2016-02-14 NOTE — H&P (Signed)
THE PATIENT WAS SEEN PRIOR TO SURGERY TODAY.  HISTORY, ALLERGIES, HOME MEDICATIONS AND OPERATIVE PROCEDURE WERE REVIEWED. RISKS AND BENEFITS OF SURGERY DISCUSSED WITH PATIENT AGAIN.  NO CHANGES FROM INITIAL HISTORY AND PHYSICAL NOTED.    

## 2016-02-14 NOTE — Transfer of Care (Signed)
Immediate Anesthesia Transfer of Care Note  Patient: Ronie SpiesJustin K Spring  Procedure(s) Performed: Procedure(s): REPAIR EXTENSOR TENDON (Left)  Patient Location: PACU  Anesthesia Type:General  Level of Consciousness: awake and alert   Airway & Oxygen Therapy: Patient Spontanous Breathing and Patient connected to face mask oxygen  Post-op Assessment: Report given to RN and Post -op Vital signs reviewed and stable  Post vital signs: Reviewed and stable  Last Vitals:  Vitals:   02/14/16 1151 02/14/16 1420  BP: 128/84 (!) 145/101  Pulse: 65 (!) 102  Resp: 16 (!) 9  Temp: 37 C 36.4 C    Last Pain:  Vitals:   02/14/16 1151  TempSrc: Oral  PainSc: 5       Patients Stated Pain Goal: 2 (02/14/16 1151)  Complications: No apparent anesthesia complications

## 2016-04-09 ENCOUNTER — Emergency Department: Payer: Managed Care, Other (non HMO)

## 2016-04-09 ENCOUNTER — Emergency Department
Admission: EM | Admit: 2016-04-09 | Discharge: 2016-04-09 | Disposition: A | Discharge status: Home / Self Care | Payer: Managed Care, Other (non HMO) | Attending: Emergency Medicine | Admitting: Emergency Medicine

## 2016-04-09 DIAGNOSIS — R0602 Shortness of breath: Secondary | ICD-10-CM | POA: Insufficient documentation

## 2016-04-09 DIAGNOSIS — R079 Chest pain, unspecified: Secondary | ICD-10-CM | POA: Diagnosis present

## 2016-04-09 LAB — CBC
HEMATOCRIT: 43 % (ref 40.0–52.0)
HEMOGLOBIN: 15.2 g/dL (ref 13.0–18.0)
MCH: 33 pg (ref 26.0–34.0)
MCHC: 35.3 g/dL (ref 32.0–36.0)
MCV: 93.3 fL (ref 80.0–100.0)
Platelets: 317 10*3/uL (ref 150–440)
RBC: 4.61 MIL/uL (ref 4.40–5.90)
RDW: 11.9 % (ref 11.5–14.5)
WBC: 12.9 10*3/uL — ABNORMAL HIGH (ref 3.8–10.6)

## 2016-04-09 LAB — BASIC METABOLIC PANEL
Anion gap: 9 (ref 5–15)
BUN: 13 mg/dL (ref 6–20)
CHLORIDE: 99 mmol/L — AB (ref 101–111)
CO2: 26 mmol/L (ref 22–32)
Calcium: 8.6 mg/dL — ABNORMAL LOW (ref 8.9–10.3)
Creatinine, Ser: 0.97 mg/dL (ref 0.61–1.24)
GFR calc Af Amer: >60 mL/min (ref >60)
GFR calc non Af Amer: >60 mL/min (ref >60)
Glucose, Bld: 95 mg/dL (ref 65–99)
POTASSIUM: 3.2 mmol/L — AB (ref 3.5–5.1)
SODIUM: 134 mmol/L — AB (ref 135–145)

## 2016-04-09 LAB — TROPONIN I: Troponin I: <0.03 ng/mL (ref <0.03)

## 2016-04-09 MED ORDER — POTASSIUM CHLORIDE CRYS ER 20 MEQ PO TBCR
40.0000 meq | EXTENDED_RELEASE_TABLET | Freq: Once | ORAL | Status: AC
  Administered 2016-04-09: 40 meq via ORAL
  Filled 2016-04-09: qty 2

## 2016-04-09 MED ORDER — LORAZEPAM 2 MG/ML IJ SOLN
1.0000 mg | Freq: Once | INTRAMUSCULAR | Status: AC
  Administered 2016-04-09: 1 mg via INTRAVENOUS
  Filled 2016-04-09: qty 1

## 2016-04-09 MED ORDER — SODIUM CHLORIDE 0.9 % IV BOLUS (SEPSIS)
1000.0000 mL | Freq: Once | INTRAVENOUS | Status: AC
  Administered 2016-04-09: 1000 mL via INTRAVENOUS

## 2016-04-09 MED ORDER — GI COCKTAIL ~~LOC~~
30.0000 mL | Freq: Once | ORAL | Status: AC
  Administered 2016-04-09: 30 mL via ORAL
  Filled 2016-04-09: qty 30

## 2016-04-09 NOTE — ED Provider Notes (Signed)
[facility] Emergency Department Provider Note  ____________________________________________   First MD Initiated Contact with Patient 04/09/16 1627     (approximate)  I have reviewed the triage vital signs and the nursing notes.   HISTORY  Chief Complaint Chest Pain   HPI Brent Hutchinson is a 36 y.o. male without any chronic medical conditions was presenting to the emergency department with left-sided chest pain. He says the pain is sharp and coming and going. He denies the pain being worsened with exertion. He does status is stable shortness of breath and says that he went numb all over his body for several minutes after the pain initially started. He says the pain is not left-sided as chest as a 4 out of 10. He says it also hurts and left side of his back as well as the right side of his jaw. He said that he has similar episode several months ago when he was evaluated in the emergency department. He denies follow-up with cardiology. He says that he goes to regularly and does cardio and does not have any chest pain or shortness of breath. She says that he has also been drinking about 8 drinks per day over the past several weeks. He has a known history of stomach ulcers and takes a PPI twice a day home. He says that he alsohas been vomiting off and on over the past 3 days. He denies any blood in his vomitus or stool. He denies any family history of heart disease. Denies smoking or drug use.   Past Medical History:  Diagnosis Date  . Allergy   . Psoriasis     Patient Active Problem List   Diagnosis Date Noted  . History of allergy 03/18/2015  . Psoriasis 03/18/2015  . Numbness and tingling 03/18/2015    Past Surgical History:  Procedure Laterality Date  . REPAIR EXTENSOR TENDON Left 02/14/2016   Procedure: REPAIR EXTENSOR TENDON;  Surgeon: Deeann Saint, MD;  Location: [facility] ORS;  Service: Orthopedics;  Laterality: Left;    Prior to Admission  medications   Medication Sig Start Date End Date Taking? Authorizing Provider  amoxicillin (AMOXIL) 500 MG capsule Take 1,000 mg by mouth every 8 (eight) hours. 03/23/16  Yes Historical Provider, MD  clarithromycin (BIAXIN) 500 MG tablet Take 500 mg by mouth 2 (two) times daily. 03/23/16  Yes Historical Provider, MD  lansoprazole (PREVACID) 15 MG capsule Take 15 mg by mouth 2 (two) times daily. 03/23/16  Yes Historical Provider, MD  Multiple Vitamins-Minerals (MULTIVITAMIN ADULT PO) Take 1 tablet by mouth daily.   Yes Historical Provider, MD  cephALEXin (KEFLEX) 500 MG capsule Take 1 capsule (500 mg total) by mouth 4 (four) times daily. Patient not taking: Reported on 04/09/2016 02/07/16   Tommi Rumps, PA-C  gabapentin (NEURONTIN) 400 MG capsule Take 1 capsule (400 mg total) by mouth 2 (two) times daily. Patient not taking: Reported on 04/09/2016 02/14/16   Deeann Saint, MD  HYDROcodone-acetaminophen [facility]) 7.5-325 MG tablet Take 1 tablet by mouth every 6 (six) hours as needed for moderate pain. Patient not taking: Reported on 04/09/2016 02/14/16   Deeann Saint, MD  HYDROcodone-acetaminophen (NORCO/VICODIN) 5-325 MG tablet Take 1 tablet by mouth every 6 (six) hours as needed for moderate pain. Patient not taking: Reported on 04/09/2016 02/07/16   Tommi Rumps, PA-C  meloxicam (MOBIC) 15 MG tablet Take 1 tablet (15 mg total) by mouth daily. Patient not taking: Reported on 04/09/2016 02/14/16   Deeann Saint,  MD  sulfamethoxazole-trimethoprim (BACTRIM DS,SEPTRA DS) 800-160 MG tablet Take 1 tablet by mouth 2 (two) times daily. Patient not taking: Reported on 04/09/2016 02/07/16   Tommi Rumpshonda L Summers, PA-C    Allergies Patient has no known allergies.  No family history on file.  Social History Social History  Substance Use Topics  . Smoking status: Never Smoker  . Smokeless tobacco: Never Used  . Alcohol use 0.0 oz/week     Comment: 5-8 drinks on the weekend; drinks hard liquor    Review  of Systems Constitutional: No fever/chills Eyes: No visual changes. ENT: No sore throat. Cardiovascular: As above Respiratory: As above Gastrointestinal: No abdominal pain.   No diarrhea.  No constipation. Genitourinary: Negative for dysuria. Musculoskeletal: Negative for back pain. Skin: Negative for rash. Neurological: Negative for headaches, focal weakness or numbness.  10-point ROS otherwise negative.  ____________________________________________   PHYSICAL EXAM:  VITAL SIGNS: ED Triage Vitals  Enc Vitals Group     BP 04/09/16 1605 (!) 178/115     Pulse Rate 04/09/16 1605 91     Resp 04/09/16 1605 20     Temp --      Temp src --      SpO2 04/09/16 1605 100 %     Weight 04/09/16 1605 195 lb (88.5 kg)     Height 04/09/16 1605 6\' 2"  (1.88 m)     Head Circumference --      Peak Flow --      Pain Score 04/09/16 1609 4     Pain Loc --      Pain Edu? --      Excl. in GC? --     Constitutional: Alert and oriented. Well appearing and in no acute distress. Eyes: Conjunctivae are normal. PERRL. EOMI. Head: Atraumatic. Nose: No congestion/rhinnorhea. Mouth/Throat: Mucous membranes are moist.   Neck: No stridor.   Cardiovascular: Normal rate, regular rhythm. Grossly normal heart sounds.  Good peripheral circulation with equal, intact bilateral dorsalis pedis as well as radial pulses. Respiratory: Normal respiratory effort.  No retractions. Lungs CTAB. Gastrointestinal: Soft and nontender. No distention.  Musculoskeletal: No lower extremity tenderness nor edema.  No joint effusions. Neurologic:  Normal speech and language. No gross focal neurologic deficits are appreciated.  Skin:  Skin is warm, dry and intact. No rash noted. Psychiatric: Mood and affect are normal. Speech and behavior are normal.  ____________________________________________   LABS (all labs ordered are listed, but only abnormal results are displayed)  Labs Reviewed  CBC - Abnormal; Notable for the  following:       Result Value   WBC 12.9 (*)    All other components within normal limits  BASIC METABOLIC PANEL - Abnormal; Notable for the following:    Sodium 134 (*)    Potassium 3.2 (*)    Chloride 99 (*)    Calcium 8.6 (*)    All other components within normal limits  TROPONIN I  TROPONIN I  URINE DRUG SCREEN, QUALITATIVE ([facility] ONLY)   ____________________________________________  EKG  ED ECG REPORT I, Arelia LongestSchaevitz,  Tiwatope Emmitt M, the attending physician, personally viewed and interpreted this ECG.   Date: 04/09/2016  EKG Time: 1610  Rate: 80  Rhythm: normal sinus rhythm  Axis: Normal axis  Intervals:right bundle branch block  ST&T Change: No ST segment elevation or depression. Right bundle branch block without any change from 07/26/2015.  ____________________________________________  RADIOLOGY    DG Chest 2 View (Final result)  Result time 04/09/16 17:04:39  Final result by Myles Rosenthal, MD (04/09/16 17:04:39)           Narrative:   CLINICAL DATA: Left-sided chest pain with nausea and vomiting for 3 days. Lightheadedness and syncopal episode earlier today.  EXAM: CHEST 2 VIEW  COMPARISON: 07/26/2015  FINDINGS: The heart size and mediastinal contours are within normal limits. Both lungs are clear. The visualized skeletal structures are unremarkable.  IMPRESSION: No active cardiopulmonary disease.   Electronically Signed By: Myles Rosenthal M.D. On: 04/09/2016 17:04            ____________________________________________   PROCEDURES  Procedure(s) performed:   Procedures  Critical Care performed:  ____________________________________________   INITIAL IMPRESSION / ASSESSMENT AND PLAN / ED COURSE  Pertinent labs & imaging results that were available during my care of the patient were reviewed by me and considered in my medical decision making (see chart for details).  PERC negative  ----------------------------------------- 8:18  PM on 04/09/2016 -----------------------------------------  Patient says that he is chest pain-free at this time. Says that the GI cocktail relieved his pain completely. He has had 2 negative troponins at this time. Specifically, the patient also denies cocaine use. Denies any testosterone or other hormone supplementation. We're unable to obtain a urine drug screen. Patient did not give urine. Also hypertensive and has a history of being hypertensive at his ER visits. However, he says that at a normal time such as when he has not in the emergency department, he has blood pressure of about 120/80. He says that he is checked at the supermarket and has obtained these values. Because of this, I will not be starting him on any antihypertensives this visit. However, we discussed the need for follow-up with cardiology because of these episodes of repeat chest pain as well as hypertension. We also discussed decreasing his drinking which may be contributing to GERD which would also potentially 20 symptoms. He is understanding of plan for outpatient follow-up in one to comply. He requested gastroenterology follow-up in addition to cardiology. I supplied a gastroenterology follow up information on his discharge papers.      ____________________________________________   FINAL CLINICAL IMPRESSION(S) / ED DIAGNOSES  Final diagnoses:  Nonspecific chest pain      NEW MEDICATIONS STARTED DURING THIS VISIT:  New Prescriptions   No medications on file     Note:  This document was prepared using Dragon voice recognition software and may include unintentional dictation errors.    Myrna Blazer, MD 04/09/16 2020

## 2016-04-09 NOTE — ED Triage Notes (Addendum)
Pt arrives to ER via POV c/o sharp CP to left side that awoke him out of sleep 3 hours PTA. Pt c/o blurry vision and dizziness. Pt dry heaving at time of triage. Pt appears anxious, hyperventilating at this time. Pt currently being treated for stomach ulcers, he did have alcohol last at 1AM today. Pt has been drinking daily, "I'm trying to quit".

## 2016-04-09 NOTE — ED Notes (Signed)
Spoke with Dr. Karl BalesSchaevtiz regarding patient sx and VS. Verbal orders received.

## 2016-04-12 ENCOUNTER — Encounter: Payer: Self-pay | Admitting: Gastroenterology

## 2016-04-12 ENCOUNTER — Ambulatory Visit (INDEPENDENT_AMBULATORY_CARE_PROVIDER_SITE_OTHER): Payer: 59 | Admitting: Gastroenterology

## 2016-04-12 ENCOUNTER — Other Ambulatory Visit: Payer: Self-pay

## 2016-04-12 VITALS — BP 144/102 | HR 78 | Ht 74.0 in | Wt 195.0 lb

## 2016-04-12 DIAGNOSIS — G8929 Other chronic pain: Secondary | ICD-10-CM | POA: Diagnosis not present

## 2016-04-12 DIAGNOSIS — R1013 Epigastric pain: Secondary | ICD-10-CM | POA: Diagnosis not present

## 2016-04-12 DIAGNOSIS — R112 Nausea with vomiting, unspecified: Secondary | ICD-10-CM | POA: Diagnosis not present

## 2016-04-12 NOTE — Progress Notes (Signed)
Gastroenterology Consultation  Referring Provider:     Dr. Raynelle Chary Primary Care Physician:  No PCP Per Patient Primary Gastroenterologist:  Dr. Servando Snare     Reason for Consultation:     Chest pain        HPI:   Brent Hutchinson is a 36 y.o. y/o male referred for consultation & management of Chest pain by Dr. Bonnetta Barry PCP Per Patient.  This patient comes today after being seen in the emergency room for chest pain. The patient has a history of peptic ulcer disease and reports he was taking a PPI twice a day. The patient was checked for cardiac abnormalities due to his history of high blood pressure and chest pain that was worse with exertion and his troponins were negative 2. The patient was then given a GI cocktail and his symptoms improved greatly. The patient also was having some nausea and vomiting associated with his chest pain and denies any sign of any GI bleeding. The patient denies any blood in his vomitus. The patient was recommended to follow-up with cardiology upon discharge from the ER and was also told to follow-up with GI. The patient reports that his pain is in the epigastric area and goes to his back and his left shoulder. He reports that over the last year he has been not taking is good as care as he should be reports that he has had increased alcohol intake over the last year but has now stopped.  Past Medical History:  Diagnosis Date  . Allergy   . Psoriasis     Past Surgical History:  Procedure Laterality Date  . REPAIR EXTENSOR TENDON Left 02/14/2016   Procedure: REPAIR EXTENSOR TENDON;  Surgeon: Deeann Saint, MD;  Location: [facility] ORS;  Service: Orthopedics;  Laterality: Left;    Prior to Admission medications   Medication Sig Start Date End Date Taking? Authorizing Provider  amoxicillin (AMOXIL) 500 MG capsule Take 1,000 mg by mouth every 8 (eight) hours. 03/23/16   Historical Provider, MD  cephALEXin (KEFLEX) 500 MG capsule Take 1 capsule (500 mg total) by mouth 4 (four)  times daily. Patient not taking: Reported on 04/09/2016 02/07/16   Tommi Rumps, PA-C  clarithromycin (BIAXIN) 500 MG tablet Take 500 mg by mouth 2 (two) times daily. 03/23/16   Historical Provider, MD  gabapentin (NEURONTIN) 400 MG capsule Take 1 capsule (400 mg total) by mouth 2 (two) times daily. Patient not taking: Reported on 04/09/2016 02/14/16   Deeann Saint, MD  HYDROcodone-acetaminophen [facility]) 7.5-325 MG tablet Take 1 tablet by mouth every 6 (six) hours as needed for moderate pain. Patient not taking: Reported on 04/09/2016 02/14/16   Deeann Saint, MD  HYDROcodone-acetaminophen (NORCO/VICODIN) 5-325 MG tablet Take 1 tablet by mouth every 6 (six) hours as needed for moderate pain. Patient not taking: Reported on 04/09/2016 02/07/16   Tommi Rumps, PA-C  lansoprazole (PREVACID) 15 MG capsule Take 15 mg by mouth 2 (two) times daily. 03/23/16   Historical Provider, MD  meloxicam (MOBIC) 15 MG tablet Take 1 tablet (15 mg total) by mouth daily. Patient not taking: Reported on 04/09/2016 02/14/16   Deeann Saint, MD  Multiple Vitamins-Minerals (MULTIVITAMIN ADULT PO) Take 1 tablet by mouth daily.    Historical Provider, MD  sulfamethoxazole-trimethoprim (BACTRIM DS,SEPTRA DS) 800-160 MG tablet Take 1 tablet by mouth 2 (two) times daily. Patient not taking: Reported on 04/09/2016 02/07/16   Tommi Rumps, PA-C    No family history on file.  Social History  Substance Use Topics  . Smoking status: Never Smoker  . Smokeless tobacco: Never Used  . Alcohol use 0.0 oz/week     Comment: 5-8 drinks on the weekend; drinks hard liquor    Allergies as of 04/12/2016  . (No Known Allergies)    Review of Systems:    All systems reviewed and negative except where noted in HPI.   Physical Exam:  There were no vitals taken for this visit. No LMP for male patient. Psych:  Alert and cooperative. Normal mood and affect. General:   Alert,  Well-developed, well-nourished, pleasant and  cooperative in NAD Head:  Normocephalic and atraumatic. Eyes:  Sclera clear, no icterus.   Conjunctiva pink. Ears:  Normal auditory acuity. Nose:  No deformity, discharge, or lesions. Mouth:  No deformity or lesions,oropharynx pink & moist. Neck:  Supple; no masses or thyromegaly. Lungs:  Respirations even and unlabored.  Clear throughout to auscultation.   No wheezes, crackles, or rhonchi. No acute distress. Heart:  Regular rate and rhythm; no murmurs, clicks, rubs, or gallops. Abdomen:  Normal bowel sounds.  No bruits.  Soft, non-tender and non-distended without masses, hepatosplenomegaly or hernias noted.  No guarding or rebound tenderness.  Negative Carnett sign.   Rectal:  Deferred.  Msk:  Symmetrical without gross deformities.  Good, equal movement & strength bilaterally. Pulses:  Normal pulses noted. Extremities:  No clubbing or edema.  No cyanosis. Neurologic:  Alert and oriented x3;  grossly normal neurologically. Skin:  Intact without significant lesions or rashes.  No jaundice. Lymph Nodes:  No significant cervical adenopathy. Psych:  Alert and cooperative. Normal mood and affect.  Imaging Studies: Dg Chest 2 View  Result Date: 04/09/2016 CLINICAL DATA:  Left-sided chest pain with nausea and vomiting for 3 days. Lightheadedness and syncopal episode earlier today. EXAM: CHEST  2 VIEW COMPARISON:  07/26/2015 FINDINGS: The heart size and mediastinal contours are within normal limits. Both lungs are clear. The visualized skeletal structures are unremarkable. IMPRESSION: No active cardiopulmonary disease. Electronically Signed   By: Myles RosenthalJohn  Stahl M.D.   On: 04/09/2016 17:04    Assessment and Plan:   Brent Hutchinson is a 36 y.o. y/o male who comes in today with a history of being in the emergency department with chest pain. The patient was thought to have symptoms consistent with GERD. The patient will have his blood sent off for lipase levels he will also be started on Dexalone samples  and will be set up for an EGD to rule out any peptic ulcer disease.I have discussed risks & benefits which include, but are not limited to, bleeding, infection, perforation & drug reaction.  The patient agrees with this plan & written consent will be obtained.       Midge Miniumarren Karel Turpen, MD. Clementeen GrahamFACG   Note: This dictation was prepared with Dragon dictation along with smaller phrase technology. Any transcriptional errors that result from this process are unintentional.

## 2016-04-13 LAB — LIPASE: Lipase: 157 U/L — ABNORMAL HIGH (ref 13–78)

## 2016-04-14 ENCOUNTER — Encounter: Payer: Self-pay | Admitting: *Deleted

## 2016-04-14 NOTE — Discharge Instructions (Signed)

## 2016-04-17 ENCOUNTER — Ambulatory Visit
Admission: RE | Admit: 2016-04-17 | Payer: Managed Care, Other (non HMO) | Source: Ambulatory Visit | Admitting: Gastroenterology

## 2016-04-17 ENCOUNTER — Other Ambulatory Visit: Payer: Self-pay

## 2016-04-17 ENCOUNTER — Telehealth: Payer: Self-pay

## 2016-04-17 ENCOUNTER — Encounter: Payer: Self-pay | Admitting: Anesthesiology

## 2016-04-17 DIAGNOSIS — R748 Abnormal levels of other serum enzymes: Secondary | ICD-10-CM

## 2016-04-17 HISTORY — DX: Gastro-esophageal reflux disease without esophagitis: K21.9

## 2016-04-17 HISTORY — DX: Unspecified convulsions: R56.9

## 2016-04-17 SURGERY — ESOPHAGOGASTRODUODENOSCOPY (EGD) WITH PROPOFOL
Anesthesia: Choice

## 2016-04-17 NOTE — Telephone Encounter (Signed)
Mailed lab order request to pt.

## 2016-04-17 NOTE — Telephone Encounter (Signed)
-----   Message from Midge Miniumarren Wohl, MD sent at 04/16/2016 12:16 PM EST ----- Let the patient know that his lipase was elevated but should come down with avoidance of alcohol.  We should recheck it again and 2 weeks.

## 2016-04-17 NOTE — Telephone Encounter (Signed)
Left vm with results and repeat lab. Lab order placed.

## 2016-04-18 ENCOUNTER — Ambulatory Visit: Payer: Self-pay | Admitting: Gastroenterology

## 2016-04-28 ENCOUNTER — Other Ambulatory Visit: Payer: Self-pay

## 2016-05-02 ENCOUNTER — Telehealth: Payer: Self-pay | Admitting: Gastroenterology

## 2016-05-02 ENCOUNTER — Encounter: Payer: Self-pay | Admitting: *Deleted

## 2016-05-02 NOTE — Telephone Encounter (Signed)
Patient left a voice message that his procedure is Friday and he would like to have some more samples until then. He didn't say what it was. Please call

## 2016-05-03 ENCOUNTER — Encounter: Payer: Self-pay | Admitting: Student in an Organized Health Care Education/Training Program

## 2016-05-04 NOTE — Telephone Encounter (Addendum)
Pt has been scheduled for an EGD on Friday, Feb 16th. Pt has called and cancelled procedure. Samples were going to be given to pt at that time if he still needed. [facility] to [facility]. Pt was upset about owing so much money to the hospital, and then hung up on receptionist.

## 2016-05-05 ENCOUNTER — Ambulatory Visit
Admission: RE | Admit: 2016-05-05 | Payer: Managed Care, Other (non HMO) | Source: Ambulatory Visit | Admitting: Gastroenterology

## 2016-05-05 SURGERY — ESOPHAGOGASTRODUODENOSCOPY (EGD) WITH PROPOFOL
Anesthesia: Choice

## 2016-11-14 ENCOUNTER — Ambulatory Visit (INDEPENDENT_AMBULATORY_CARE_PROVIDER_SITE_OTHER): Payer: Self-pay | Admitting: Family Medicine

## 2016-11-14 ENCOUNTER — Encounter: Payer: Self-pay | Admitting: Family Medicine

## 2016-11-14 VITALS — BP 100/70 | HR 91 | Temp 97.9°F | Resp 16 | Wt 198.0 lb

## 2016-11-14 DIAGNOSIS — L03317 Cellulitis of buttock: Secondary | ICD-10-CM

## 2016-11-14 MED ORDER — CEPHALEXIN 500 MG PO CAPS: 500.0000 mg | ORAL_CAPSULE | Freq: Four times a day (QID) | ORAL | 0 refills | Status: AC

## 2016-11-14 NOTE — Progress Notes (Signed)
       Patient: Brent Hutchinson Male    DOB: 12/29/1980   36 y.o.   MRN: [id number] Visit Date: 11/14/2016  Today's Provider: Mila Merry, MD   No chief complaint on file.  Subjective:    Patient had steroid injection (self given) 1 week ago. Patient went to urgent care 2 days later because he started running a fever and had redness and swelling around injection site. Patient was  Prescribed 7 days of doxycycline which he is still taking. Patient states there is still a knot on right buttock but swelling and pain has mostly resolved. He will be traveling out of the country in a couple day and concerned it may flare back up.        No Known Allergies   Current Outpatient Prescriptions:  .  amoxicillin (AMOXIL) 500 MG capsule, amoxicillin 500 mg capsule, Disp: , Rfl:  .  clarithromycin (BIAXIN) 500 MG tablet, clarithromycin 500 mg tablet, Disp: , Rfl:   Review of Systems  Constitutional: Negative for appetite change, chills and fever.  Respiratory: Negative for chest tightness, shortness of breath and wheezing.   Cardiovascular: Negative for chest pain and palpitations.  Gastrointestinal: Negative for abdominal pain, nausea and vomiting.    Social History  Substance Use Topics  . Smoking status: Former Games developer  . Smokeless tobacco: Never Used     Comment: smoked while in college  . Alcohol use 0.0 oz/week     Comment: 5-8 drinks on the weekend; drinks hard liquor   Objective:   BP 100/70 (BP Location: Left Arm, Patient Position: Sitting, Cuff Size: Large)   Pulse 91   Temp 97.9 F (36.6 C) (Oral)   Resp 16   Wt 198 lb (89.8 kg)   SpO2 97%   BMI 25.42 kg/m     Physical Exam  Small very faint area of erythema on right buttocks. Minimal tenderness. No mass.     Assessment & Plan:     1. Cellulitis of buttock Greatly improved on doxycycline. Consider he will be travelling out of the country and will be out in the sun, have sent prescription for cephalexin to take  incase the area flares back up while gone.        Mila Merry, MD  [facility]

## 2016-12-06 ENCOUNTER — Encounter: Payer: Self-pay | Admitting: *Deleted

## 2016-12-06 ENCOUNTER — Emergency Department
Admission: EM | Admit: 2016-12-06 | Discharge: 2016-12-06 | Disposition: A | Discharge status: Home / Self Care | Payer: Managed Care, Other (non HMO) | Attending: Emergency Medicine | Admitting: Emergency Medicine

## 2016-12-06 DIAGNOSIS — Z5321 Procedure and treatment not carried out due to patient leaving prior to being seen by health care provider: Secondary | ICD-10-CM | POA: Insufficient documentation

## 2016-12-06 DIAGNOSIS — R05 Cough: Secondary | ICD-10-CM | POA: Insufficient documentation

## 2016-12-06 DIAGNOSIS — R079 Chest pain, unspecified: Secondary | ICD-10-CM | POA: Insufficient documentation

## 2016-12-06 DIAGNOSIS — R0602 Shortness of breath: Secondary | ICD-10-CM | POA: Insufficient documentation

## 2016-12-06 LAB — CBC
HCT: 41 % (ref 40.0–52.0)
HEMOGLOBIN: 14.6 g/dL (ref 13.0–18.0)
MCH: 34.3 pg — ABNORMAL HIGH (ref 26.0–34.0)
MCHC: 35.5 g/dL (ref 32.0–36.0)
MCV: 96.5 fL (ref 80.0–100.0)
Platelets: 389 10*3/uL (ref 150–440)
RBC: 4.24 MIL/uL — ABNORMAL LOW (ref 4.40–5.90)
RDW: 14 % (ref 11.5–14.5)
WBC: 10.4 10*3/uL (ref 3.8–10.6)

## 2016-12-06 LAB — TROPONIN I

## 2016-12-06 LAB — BASIC METABOLIC PANEL
ANION GAP: 10 (ref 5–15)
BUN: <5 mg/dL — ABNORMAL LOW (ref 6–20)
CALCIUM: 9 mg/dL (ref 8.9–10.3)
CO2: 28 mmol/L (ref 22–32)
Chloride: 103 mmol/L (ref 101–111)
Creatinine, Ser: 0.78 mg/dL (ref 0.61–1.24)
GLUCOSE: 109 mg/dL — AB (ref 65–99)
Potassium: 4 mmol/L (ref 3.5–5.1)
Sodium: 141 mmol/L (ref 135–145)
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
## 2016-12-07 ENCOUNTER — Telehealth: Payer: Self-pay | Admitting: Emergency Medicine

## 2016-12-07 NOTE — Telephone Encounter (Signed)
Called patient due to lwot to inquire about condition and follow up plans. Left message with my number and instructions to call his doctor as well.

## 2017-04-15 IMAGING — CR DG CHEST 2V
2 series · 2 of 2 positions shown · non-contrast
Comparison: 07/26/2015

CLINICAL DATA: Left-sided chest pain with nausea and vomiting for 3
days. Lightheadedness and syncopal episode earlier today.

EXAM:
CHEST  2 VIEW

[chest pa]
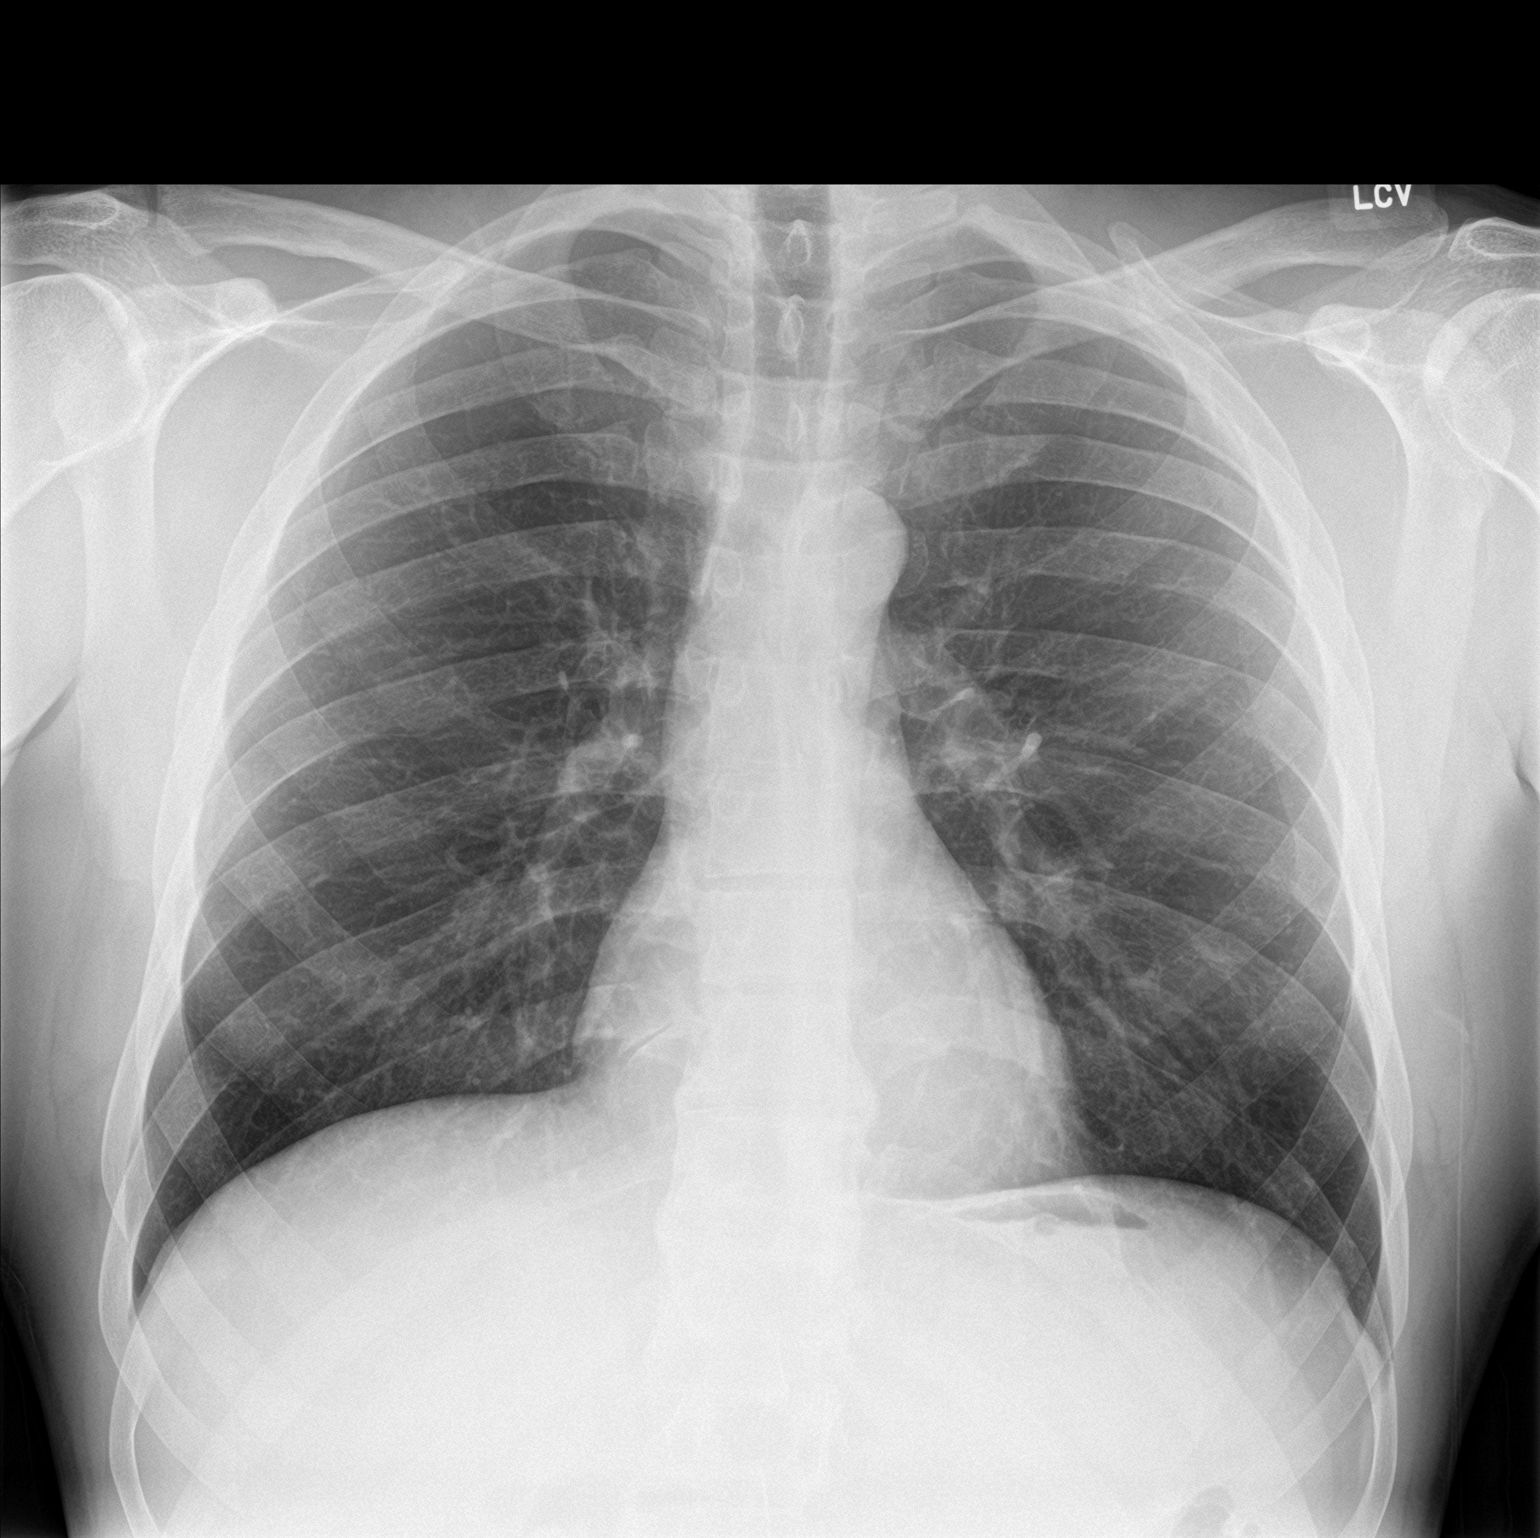

[chest lat]
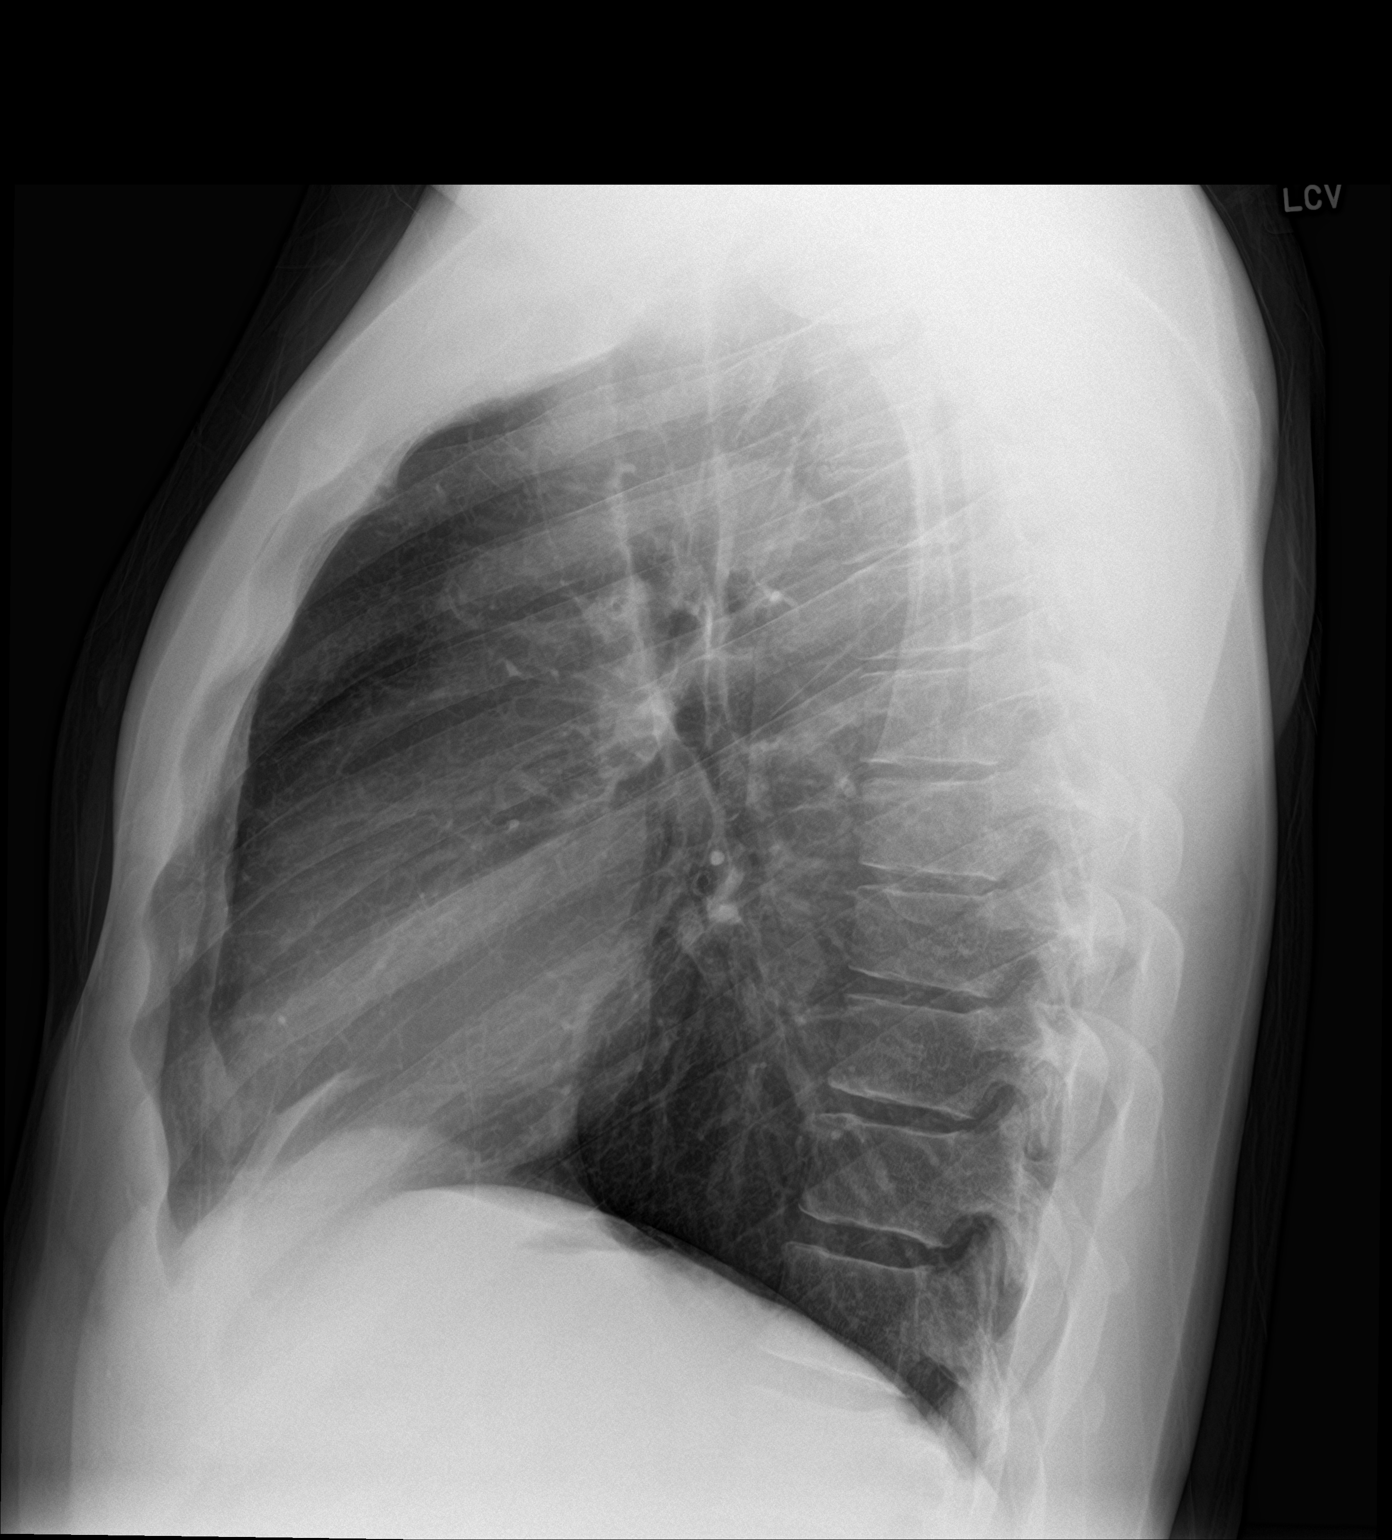

[2 of 2 positions shown; findings below may reference images not displayed]

FINDINGS: The heart size and mediastinal contours are within normal limits.
Both lungs are clear. The visualized skeletal structures are
unremarkable.
IMPRESSION: No active cardiopulmonary disease.
